# Patient Record
Sex: Female | Born: 1965 | Race: Black or African American | Hispanic: No | Marital: Single | Smoking: Never smoker
Health system: Southern US, Community
[De-identification: ages and names within clinical notes are randomized; demographics above are authoritative.]

## PROBLEM LIST (undated history)

## (undated) HISTORY — PX: CERVICAL CERCLAGE: SHX1329

---

## 2005-10-13 ENCOUNTER — Observation Stay
Admission: RE | Admit: 2005-10-13 | Disposition: A | Discharge status: Home / Self Care | Payer: Self-pay | Source: Ambulatory Visit | Admitting: Obstetrics

## 2005-10-15 ENCOUNTER — Inpatient Hospital Stay
Admission: RE | Admit: 2005-10-15 | Disposition: A | Discharge status: Home / Self Care | Payer: Self-pay | Source: Ambulatory Visit | Admitting: Obstetrics

## 2005-11-26 ENCOUNTER — Ambulatory Visit
Admit: 2005-11-26 | Disposition: A | Discharge status: Home / Self Care | Payer: Self-pay | Source: Ambulatory Visit | Admitting: Obstetrics

## 2005-12-04 ENCOUNTER — Observation Stay
Admission: RE | Admit: 2005-12-04 | Disposition: A | Discharge status: Home / Self Care | Payer: Self-pay | Source: Ambulatory Visit | Admitting: Obstetrics

## 2005-12-04 LAB — COMPREHENSIVE METABOLIC PANEL - AH CERNER
ALT: 22 U/L (ref 0–31)
AST (SGOT): 48 U/L — ABNORMAL HIGH (ref 0–31)
Albumin/Globulin Ratio: 1.2 (ref 1.1–2.2)
Albumin: 3.6 g/dL (ref 3.4–4.8)
Alkaline Phosphatase: 148 U/L — ABNORMAL HIGH (ref 35–104)
Anion Gap: 11 mEq/L (ref 5–15)
BUN: 9 mg/dL (ref 6–20)
Bilirubin, Total: 0.4 mg/dL (ref 0.0–1.0)
CA: 4.3 mEq/L (ref 3.8–4.6)
CO2: 22.2 mEq/L (ref 22.0–29.0)
Calcium: 9.5 mg/dL (ref 8.4–10.2)
Chloride: 105 mEq/L (ref 96–108)
Creatinine: 0.6 mg/dL (ref 0.4–1.1)
Globulin: 3 g/dL (ref 2.0–3.6)
Glucose: 68 mg/dL
Osmolality Calculated: 283 mosm/kg (ref 282–298)
Potassium: 5.1 mEq/L (ref 3.3–5.1)
Protein, Total: 6.6 g/dL (ref 6.4–8.3)
Sodium: 138 mEq/L (ref 135–145)
UN/CREA SOFT: 15 RATIO (ref 6–33)

## 2005-12-04 LAB — CBC WITH AUTO DIFFERENTIAL CERNER
Basophils Absolute: 0 /mm3 (ref 0.0–0.2)
Basophils: 0 % (ref 0–2)
Eosinophils Absolute: 0.1 /mm3 (ref 0.0–0.2)
Eosinophils: 1 % (ref 0–5)
Granulocytes Absolute: 11.5 /mm3 — ABNORMAL HIGH (ref 1.8–8.1)
Hematocrit: 40.7 % (ref 37.0–47.0)
Hgb: 14.4 G/DL (ref 13.0–17.0)
Lymphocytes Absolute: 2 /mm3 (ref 0.5–4.4)
Lymphocytes: 13 % — ABNORMAL LOW (ref 15–41)
MCH: 32.3 PG — ABNORMAL HIGH (ref 28.0–32.0)
MCHC: 35.4 G/DL (ref 32.0–36.0)
MCV: 91.3 FL (ref 80.0–100.0)
MPV: 10.2 FL (ref 7.4–10.4)
Monocytes Absolute: 1.8 /mm3 — ABNORMAL HIGH (ref 0.0–1.2)
Monocytes: 12 % — ABNORMAL HIGH (ref 0–11)
Neutrophils %: 75 % (ref 52–75)
Platelets: 204 /mm3 (ref 140–400)
RBC: 4.46 /mm3 (ref 4.20–5.40)
RDW: 13.2 % (ref 11.5–15.0)
WBC: 15.4 /mm3 — ABNORMAL HIGH (ref 3.5–10.8)

## 2005-12-04 LAB — URINALYSIS WITH MICROSCOPIC
Bilirubin, UA: NEGATIVE
Blood, UA: NEGATIVE
Glucose, UA: NEGATIVE
Ketones UA: NEGATIVE
Leukocyte Esterase, UA: NEGATIVE
Nitrite, UA: NEGATIVE
Protein, UR: NEGATIVE
Specific Gravity UA POCT: 1.017 (ref 1.005–1.030)
Squamous Epithelial Cells, Urine: 2 /LPF — ABNORMAL HIGH (ref 0–0)
Urine pH: 6.5 (ref 4.6–8.0)
Urobilinogen, UA: 0.2 EU/dL (ref 0.2–1.0)

## 2005-12-04 LAB — GFR

## 2005-12-04 LAB — HEMOLYSIS INDEX: Hemolysis Index: 241 Units

## 2005-12-07 ENCOUNTER — Inpatient Hospital Stay
Admission: RE | Admit: 2005-12-07 | Disposition: A | Discharge status: Home / Self Care | Payer: Self-pay | Source: Ambulatory Visit | Admitting: Obstetrics

## 2005-12-07 LAB — CBC WITH AUTO DIFFERENTIAL CERNER
Basophils Absolute: 0 /mm3 (ref 0.0–0.2)
Basophils: 0 % (ref 0–2)
Eosinophils Absolute: 0.1 /mm3 (ref 0.0–0.2)
Eosinophils: 1 % (ref 0–5)
Granulocytes Absolute: 10.6 /mm3 — ABNORMAL HIGH (ref 1.8–8.1)
Hematocrit: 37.6 % (ref 37.0–47.0)
Hgb: 13.3 G/DL (ref 13.0–17.0)
Lymphocytes Absolute: 2.1 /mm3 (ref 0.5–4.4)
Lymphocytes: 14 % — ABNORMAL LOW (ref 15–41)
MCH: 32.4 PG — ABNORMAL HIGH (ref 28.0–32.0)
MCHC: 35.3 G/DL (ref 32.0–36.0)
MCV: 91.8 FL (ref 80.0–100.0)
MPV: 9.4 FL (ref 7.4–10.4)
Monocytes Absolute: 1.7 /mm3 — ABNORMAL HIGH (ref 0.0–1.2)
Monocytes: 12 % — ABNORMAL HIGH (ref 0–11)
Neutrophils %: 73 % (ref 52–75)
Platelets: 231 /mm3 (ref 140–400)
RBC: 4.09 /mm3 — ABNORMAL LOW (ref 4.20–5.40)
RDW: 13.1 % (ref 11.5–15.0)
WBC: 14.6 /mm3 — ABNORMAL HIGH (ref 3.5–10.8)

## 2005-12-07 LAB — PILOT TUBE(SOFT)

## 2006-10-14 ENCOUNTER — Inpatient Hospital Stay
Admission: AD | Admit: 2006-10-14 | Disposition: A | Discharge status: Home / Self Care | Payer: Self-pay | Source: Ambulatory Visit | Admitting: Obstetrics & Gynecology

## 2006-10-14 LAB — CBC WITH AUTO DIFFERENTIAL CERNER
Basophils Absolute: 0.1 /mm3 (ref 0.0–0.2)
Basophils: 1 % (ref 0–2)
Eosinophils Absolute: 0.2 /mm3 (ref 0.0–0.2)
Eosinophils: 2 % (ref 0–5)
Granulocytes Absolute: 8.7 /mm3 — ABNORMAL HIGH (ref 1.8–8.1)
Hematocrit: 37.9 % (ref 37.0–47.0)
Hgb: 12.9 G/DL (ref 12.0–16.0)
Lymphocytes Absolute: 2 /mm3 (ref 0.5–4.4)
Lymphocytes: 16 % (ref 15–41)
MCH: 32.7 PG — ABNORMAL HIGH (ref 28.0–32.0)
MCHC: 34.2 G/DL (ref 32.0–36.0)
MCV: 95.6 FL (ref 80.0–100.0)
MPV: 7.3 FL — ABNORMAL LOW (ref 7.4–10.4)
Monocytes Absolute: 1.4 /mm3 — ABNORMAL HIGH (ref 0.0–1.2)
Monocytes: 11 % (ref 0–11)
Neutrophils %: 71 % (ref 52–75)
Platelets: 330 /mm3 (ref 140–400)
RBC: 3.96 /mm3 — ABNORMAL LOW (ref 4.20–5.40)
RDW: 13.4 % (ref 11.5–15.0)
WBC: 12.4 /mm3 — ABNORMAL HIGH (ref 3.5–10.8)

## 2006-10-14 LAB — URINALYSIS WITH MICROSCOPIC
Bilirubin, UA: NEGATIVE
Blood, UA: NEGATIVE
Glucose, UA: NEGATIVE
Ketones UA: NEGATIVE
Leukocyte Esterase, UA: NEGATIVE
Nitrite, UA: NEGATIVE
Protein, UR: NEGATIVE
RBC, UA: 1 /HPF (ref 0–3)
Specific Gravity UA POCT: 1.012 (ref 1.005–1.030)
Urine pH: 7.5 (ref 4.6–8.0)
Urobilinogen, UA: 0.2 EU/dL (ref 0.2–1.0)

## 2006-10-14 LAB — PILOT TUBE(SOFT)

## 2006-10-15 LAB — FETAL FIBRONECTIN: Fetal Fibronectin: NEGATIVE

## 2006-10-19 ENCOUNTER — Ambulatory Visit
Admit: 2006-10-19 | Disposition: A | Discharge status: Home / Self Care | Payer: Self-pay | Source: Ambulatory Visit | Admitting: Obstetrics & Gynecology

## 2006-10-19 ENCOUNTER — Inpatient Hospital Stay
Admission: RE | Admit: 2006-10-19 | Disposition: A | Discharge status: Home / Self Care | Payer: Self-pay | Source: Ambulatory Visit | Admitting: Obstetrics & Gynecology

## 2006-10-19 LAB — CBC WITH AUTO DIFFERENTIAL CERNER
Basophils Absolute: 0.1 /mm3 (ref 0.0–0.2)
Basophils: 1 % (ref 0–2)
Eosinophils Absolute: 0.1 /mm3 (ref 0.0–0.2)
Eosinophils: 1 % (ref 0–5)
Granulocytes Absolute: 9.8 /mm3 — ABNORMAL HIGH (ref 1.8–8.1)
Hematocrit: 38.9 % (ref 37.0–47.0)
Hgb: 13.6 G/DL (ref 12.0–16.0)
Lymphocytes Absolute: 2.1 /mm3 (ref 0.5–4.4)
Lymphocytes: 16 % (ref 15–41)
MCH: 32.8 PG — ABNORMAL HIGH (ref 28.0–32.0)
MCHC: 34.9 G/DL (ref 32.0–36.0)
MCV: 94 FL (ref 80.0–100.0)
MPV: 7.3 FL — ABNORMAL LOW (ref 7.4–10.4)
Monocytes Absolute: 1.1 /mm3 (ref 0.0–1.2)
Monocytes: 9 % (ref 0–11)
Neutrophils %: 74 % (ref 52–75)
Platelets: 329 /mm3 (ref 140–400)
RBC: 4.14 /mm3 — ABNORMAL LOW (ref 4.20–5.40)
RDW: 13.5 % (ref 11.5–15.0)
WBC: 13.2 /mm3 — ABNORMAL HIGH (ref 3.5–10.8)

## 2006-10-19 LAB — URINALYSIS WITH MICROSCOPIC
Bilirubin, UA: NEGATIVE
Blood, UA: NEGATIVE
Glucose, UA: NEGATIVE
Ketones UA: 15
Leukocyte Esterase, UA: NEGATIVE
Nitrite, UA: NEGATIVE
Protein, UR: NEGATIVE
Specific Gravity UA POCT: 1.009 (ref 1.005–1.030)
Urine pH: 7.5 (ref 4.6–8.0)
Urobilinogen, UA: 0.2 EU/dL (ref 0.2–1.0)

## 2006-10-19 LAB — GLUCOSE, BODY FLUID: Body Fluid Glucose: 34 mg/dL

## 2006-10-19 LAB — BODY FLUID CELL COUNT
Body Fluid Polymorphonuclear Cell: 8 %
Body Fluid RBC: 898 /mm3
Body Fluid WBC: 9 /mm3

## 2006-10-19 LAB — TYPE AND SCREEN: AB Screen Gel: NEGATIVE

## 2006-10-25 LAB — CULTURE, MYCOPLASMA HOMINIS

## 2006-10-27 ENCOUNTER — Ambulatory Visit
Admit: 2006-10-27 | Disposition: A | Discharge status: Home / Self Care | Payer: Self-pay | Source: Ambulatory Visit | Admitting: Obstetrics & Gynecology

## 2006-10-30 LAB — CYTOGENETICS-AMNIOTIC FLUID

## 2006-11-10 ENCOUNTER — Ambulatory Visit
Admit: 2006-11-10 | Disposition: A | Discharge status: Home / Self Care | Payer: Self-pay | Source: Ambulatory Visit | Admitting: Psychiatry

## 2006-11-27 ENCOUNTER — Ambulatory Visit
Admit: 2006-11-27 | Disposition: A | Discharge status: Home / Self Care | Payer: Self-pay | Source: Ambulatory Visit | Admitting: Obstetrics & Gynecology

## 2006-11-30 ENCOUNTER — Ambulatory Visit
Admit: 2006-11-30 | Disposition: A | Discharge status: Home / Self Care | Payer: Self-pay | Source: Ambulatory Visit | Admitting: Obstetrics & Gynecology

## 2006-12-22 ENCOUNTER — Observation Stay
Admission: RE | Admit: 2006-12-22 | Disposition: A | Discharge status: Home / Self Care | Payer: Self-pay | Source: Ambulatory Visit | Admitting: Obstetrics & Gynecology

## 2006-12-22 LAB — URINALYSIS WITH MICROSCOPIC
Bilirubin, UA: NEGATIVE
Blood, UA: NEGATIVE
Glucose, UA: NEGATIVE
Ketones UA: NEGATIVE
Leukocyte Esterase, UA: NEGATIVE
Nitrite, UA: NEGATIVE
Protein, UR: NEGATIVE
Specific Gravity UA POCT: 1.014 (ref 1.005–1.030)
Urine pH: 6.5 (ref 4.6–8.0)
Urobilinogen, UA: 0.2 EU/dL (ref 0.2–1.0)

## 2006-12-22 LAB — CBC WITH AUTO DIFFERENTIAL CERNER
Basophils Absolute: 0 /mm3 (ref 0.0–0.2)
Basophils: 0 % (ref 0–2)
Eosinophils Absolute: 0 /mm3 (ref 0.0–0.2)
Eosinophils: 0 % (ref 0–5)
Granulocytes Absolute: 10.5 /mm3 — ABNORMAL HIGH (ref 1.8–8.1)
Hematocrit: 40.9 % (ref 37.0–47.0)
Hgb: 14 G/DL (ref 12.0–16.0)
Lymphocytes Absolute: 0.9 /mm3 (ref 0.5–4.4)
Lymphocytes: 7 % — ABNORMAL LOW (ref 15–41)
MCH: 32.5 PG — ABNORMAL HIGH (ref 28.0–32.0)
MCHC: 34.2 G/DL (ref 32.0–36.0)
MCV: 95 FL (ref 80.0–100.0)
MPV: 8.4 FL (ref 7.4–10.4)
Monocytes Absolute: 0.8 /mm3 (ref 0.0–1.2)
Monocytes: 7 % (ref 0–11)
Neutrophils %: 86 % — ABNORMAL HIGH (ref 52–75)
Platelets: 236 /mm3 (ref 140–400)
RBC: 4.3 /mm3 (ref 4.20–5.40)
RDW: 12.5 % (ref 11.5–15.0)
WBC: 12.2 /mm3 — ABNORMAL HIGH (ref 3.5–10.8)

## 2006-12-22 LAB — PILOT TUBE(SOFT)

## 2006-12-24 ENCOUNTER — Ambulatory Visit: Admit: 2006-12-24 | Disposition: A | Discharge status: Home / Self Care | Payer: Self-pay | Source: Ambulatory Visit

## 2007-01-05 ENCOUNTER — Ambulatory Visit
Admit: 2007-01-05 | Disposition: A | Discharge status: Home / Self Care | Payer: Self-pay | Source: Ambulatory Visit | Admitting: Obstetrics & Gynecology

## 2007-01-15 ENCOUNTER — Ambulatory Visit
Admit: 2007-01-15 | Disposition: A | Discharge status: Home / Self Care | Payer: Self-pay | Source: Ambulatory Visit | Admitting: Obstetrics & Gynecology

## 2007-01-18 ENCOUNTER — Inpatient Hospital Stay
Admission: RE | Admit: 2007-01-18 | Disposition: A | Discharge status: Home / Self Care | Payer: Self-pay | Source: Ambulatory Visit | Admitting: Obstetrics & Gynecology

## 2007-01-19 ENCOUNTER — Ambulatory Visit
Admit: 2007-01-19 | Disposition: A | Discharge status: Home / Self Care | Payer: Self-pay | Source: Ambulatory Visit | Admitting: Obstetrics & Gynecology

## 2008-03-09 ENCOUNTER — Ambulatory Visit: Admission: RE | Admit: 2008-03-09 | Payer: Self-pay | Source: Ambulatory Visit | Admitting: Obstetrics & Gynecology

## 2008-09-19 ENCOUNTER — Emergency Department: Admit: 2008-09-19 | Payer: Self-pay | Source: Emergency Department | Admitting: Emergency Medicine

## 2008-09-19 LAB — GFR

## 2008-09-19 LAB — CBC AND DIFFERENTIAL
Basophils Absolute: 0 /mm3 (ref 0.0–0.2)
Basophils: 0 % (ref 0–2)
Eosinophils Absolute: 0.1 /mm3 (ref 0.0–0.7)
Eosinophils: 1 % (ref 0–5)
Granulocytes Absolute: 7.6 /mm3 (ref 1.8–8.1)
Hematocrit: 43.3 % (ref 37.0–47.0)
Hgb: 14.6 G/DL (ref 12.0–16.0)
Immature Granulocytes Absolute: 0 CUMM (ref 0.0–0.0)
Immature Granulocytes: 0 % (ref 0–1)
Lymphocytes Absolute: 1.8 /mm3 (ref 0.5–4.4)
Lymphocytes: 16 % (ref 15–41)
MCH: 31.3 PG (ref 28.0–32.0)
MCHC: 33.7 G/DL (ref 32.0–36.0)
MCV: 92.9 FL (ref 80.0–100.0)
MPV: 9.8 FL (ref 9.4–12.3)
Monocytes Absolute: 1.1 /mm3 (ref 0.0–1.2)
Monocytes: 11 % (ref 0–11)
Neutrophils %: 72 % (ref 52–75)
Platelets: 343 /mm3 (ref 140–400)
RBC: 4.66 /mm3 (ref 4.20–5.40)
RDW: 12.7 % (ref 11.5–15.0)
WBC: 10.65 /mm3 (ref 3.50–10.80)

## 2008-09-19 LAB — COMPREHENSIVE METABOLIC PANEL - AH CERNER
ALT: 10 U/L (ref 0–31)
AST (SGOT): 14 U/L (ref 0–31)
Albumin/Globulin Ratio: 1.2 (ref 1.1–2.2)
Albumin: 4.1 g/dL (ref 3.4–4.8)
Alkaline Phosphatase: 46 U/L (ref 35–104)
Anion Gap: 9 mEq/L (ref 5–15)
BUN: 12 mg/dL (ref 6–20)
Bilirubin, Total: 1 mg/dL (ref 0.0–1.0)
CA: 3.9 mEq/L (ref 3.8–4.6)
CO2: 25.5 mEq/L (ref 22.0–29.0)
Calcium: 9.3 mg/dL (ref 8.4–10.2)
Chloride: 101 mEq/L (ref 96–108)
Creatinine: 0.7 mg/dL (ref 0.4–1.1)
Globulin: 3.5 g/dL (ref 2.0–3.6)
Glucose: 102 mg/dL — ABNORMAL HIGH (ref 70–100)
Osmolality Calculated: 280 mosm/kg — ABNORMAL LOW (ref 282–298)
Potassium: 4.2 mEq/L (ref 3.3–5.1)
Protein, Total: 7.6 g/dL (ref 6.4–8.3)
Sodium: 135 mEq/L (ref 133–145)
UN/CREA SOFT: 17 RATIO (ref 6–33)

## 2008-09-19 LAB — AMYLASE: Amylase: 103 U/L (ref 20–148)

## 2008-09-19 LAB — URINALYSIS WITH MICROSCOPIC
Bilirubin, UA: NEGATIVE
Blood, UA: NEGATIVE
Glucose, UA: NEGATIVE
Ketones UA: NEGATIVE
Nitrite, UA: NEGATIVE
Protein, UR: NEGATIVE
Specific Gravity UA POCT: 1.03 (ref 1.005–1.030)
Squamous Epithelial Cells, Urine: 3 /LPF — ABNORMAL HIGH (ref 0–0)
Urine pH: 6 (ref 4.6–8.0)
Urobilinogen, UA: 4 EU/dL — ABNORMAL HIGH (ref 0.2–1.0)
WBC, UA: 5 /HPF (ref 0–5)

## 2008-09-19 LAB — D-DIMER (DVT) CERNER: D-Dimer (DVT): 177 ng mL (ref 0–600)

## 2008-09-19 LAB — HEMOLYSIS INDEX: Hemolysis Index: 4 Units

## 2008-09-19 LAB — LIPASE: Lipase: 31 U/L (ref 13–60)

## 2010-10-02 ENCOUNTER — Emergency Department: Admit: 2010-10-02 | Payer: Self-pay | Source: Emergency Department | Admitting: Family

## 2011-09-03 LAB — ECG 12-LEAD
Atrial Rate: 85 {beats}/min
P Axis: 69 degrees
P-R Interval: 164 ms
Q-T Interval: 334 ms
QRS Duration: 66 ms
QTC Calculation (Bezet): 397 ms
R Axis: 69 degrees
T Axis: 38 degrees
Ventricular Rate: 85 {beats}/min

## 2011-09-18 NOTE — Discharge Summary (Signed)
PATIENTCHAVA, Robin Rollins      MEDICAL RECORD NUMBER:         [id number]            ATTENDING PHYSICIAN:           Roselle Locus, MD      DATE OF ADMISSION:             01/18/2007      DATE OF DISCHARGE:             01/20/2007            DISCHARGE DIAGNOSIS:      1.    Delivery of live female infant.            ADMITTING DIAGNOSIS:      1.    Term pregnancy.      2.    Active labor.            HOSPITAL COURSE: The patient is a 45 year old G2, P1-0-0-1 at 38 weeks by      estimated date of confinement of 02/01/07 who presented in active labor 10      centimeters dilated with vertex presentation in the peroneum.  There was      spontaneous rupture of membranes with clear fluid on admission. Group Beta      streptococcus vaginal culture was negative.  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      Emergency cerclage was placed at that time.  The cerclage was subsequently      removed at approximately 34 weeks when she had pre term contractions and was      noted to be 2 cm dilated.  The remainder of her prenatal course was      unremarkable.  On 01/18/07 she underwent spontaneous vaginal delivery of a      live female infant, 5 pounds 12.9 ounces with Apgars of 9 and 9.  Her      postpartum course was unremarkable and she was discharged to home in stable      condition with instructions to follow-up in the office in six weeks.                  Electronic Signing MD: Roselle Locus, MD            TTP:amw:kp      D:    01/20/2007      T:    01/20/2007      #:    [id number]      N:    [id number]            cc:   Roselle Locus, MD

## 2011-09-18 NOTE — Op Note (Unsigned)
PATIENTMARVELLA, Robin Rollins      MEDICAL RECORD NUMBER:         [id number]      PATIENT LOCATION/SERVICE:      LAD 2013A            DATE OF SURGERY:               10/19/2006      SURGEON:                       Chalmers Guest, MD      ASSISTANT 1:            PREOPERATIVE DIAGNOSES      1.   Intrauterine pregnancy at 25-1/7 weeks.      2.   Suspect incompetent cervix.            POSTOPERATIVE DIAGNOSES      1.   Intrauterine pregnancy at 25-1/7 weeks.      2.   Suspect incompetent cervix.            PROCEDURE:  Physical examination indicated McDonald cerclage.            ANESTHESIA:  Epidural.            INTRAVENOUS FLUIDS:  600 mL of lactated Ringer's.            FINDINGS:  A cervix that is dilated 2 cm through the complete internal and      external os with membranes to the external os though not protruding through      it.            COMPLICATIONS:  There were no complications.            ESTIMATED BLOOD LOSS:  Minimal.            DRAINS:  No drains.            INDICATIONS FOR PROCEDURE:  This is a 45 year old G2, P1 who was followed      during her previous pregnancy for short cervix and preterm labor but who      ultimately went to full term without any need for surgical intervention.      She presented in this pregnancy with short cervix.  Initially, this was      thought to be due to some preterm contractions.  She was tocolyzed and sent      home.  She then returned at 25 weeks, about a week later, with dilation of      the cervix and now acontractile.  The presentation at this point was now      thought to be more consistent with cervical incompetence.  The patient was      offered amniocentesis to rule out intra-amniotic infection and further      possibility of a cerclage.  No intra-amniotic infection was suggested by      preliminary results and the patient was offered cervical cerclage.  The      risks and benefits of the procedure including risks of infection, bleeding,      rupture of  membranes, failure of the procedure were explained to the      patient and she was brought to the operating room.            DESCRIPTION OF  PROCEDURE:  The patient was brought to the operating room as      above.  Epidural anesthesia was obtained without difficulty.  She was      prepped and draped in the normal sterile fashion in the dorsolithotomy      position.  Speculum was placed into the patient's vagina.  Cervix was      visualized and a circumferential suture, 5 mm Mersilene, was placed from 12      o'clock to 9 o'clock, 9 o'clock to 6 o'clock, 6 o'clock to 3 o'clock and      back to the 12 o'clock position.  This was cinched down tightly.  A total      of 7 knots were placed.  Following the procedure, the patient was brought      to the recovery area where she will be observed overnight.  She will      receive antibiotics and Indocin overnight and contractions will be      monitored for.                                          ___________________________________     Date Signed: _______________      Chalmers Guest, MD                  [id number]      D:    10/19/2006      T:    10/20/2006      #:    [id number]      N:    [id number]            cc:   Chalmers Guest, MD

## 2011-09-19 NOTE — Op Note (Unsigned)
PATIENTLENNIX, Robin Rollins      MEDICAL RECORD NUMBER:         [id number]      PATIENT LOCATION/SERVICE:       [id number]            DATE OF SURGERY:               03/09/2008      SURGEON:                       Clarnce Flock, MD      ASSISTANT 1:            ANESTHESIA: TIVA and local.            PREOPERATIVE DIAGNOSIS:  Bilateral Bartholin gland cyst.            POSTOPERATIVE DIAGNOSIS: Bilateral Bartholin gland cyst.            PROCEDURE: Marsupialization of bilateral Bartholin gland cyst.            FINDINGS:  Four centimeter left Bartholin cyst with contents purulent.      Right Bartholin cyst approximately three centimeter with clear fluid as      contents.  No other abnormal findings.            PATHOLOGY: Bartholin gland cyst walls.            COMPLICATIONS: None.            DESCRIPTION OF THE PROCEDURE: The patient was taken to the operating room      where once adequate level of anesthesia was reached was prepared and draped      in the usual sterile fashion in the dorsal lithotomy position.  The      Bartholin gland cyst was noted on the left side and using 0.50% Marcaine      plain this was injected subcutaneously.  An incision was made within the      Hymenal ring that was approximately 2.5 centimeter in length until the cyst      was identified and the cyst ruptured and the above findings were noted.  A      biopsy was taken of this cyst wall and a culture was taken of the cyst      contents.  The cyst wall was then everted and sutured using interrupted      #3-0 Chromic sutures so that the opening was secured and everted to      complete the marsupialization.  This was performed circumferentially around      the edges.  Excellent hemostasis was noted.  In a similar fashion the right      Bartholin gland cyst was injected subcutaneously using 0.5% Marcaine plain.      An incision was made within the hymenal ring approximately 2 centimeter in      length and carried through until  the cyst was ruptured and then the cyst      wall was biopsied and the cyst wall was everted so that it was sutured to      the vaginal wall so that the opening would be secured in a circumferential      fashion using #3-0 Chromic suture. Excellent hemostasis was noted.  All      instruments, sponge counts and needle counts  were correct x 2.  The patient      tolerated the procedure well and was transferred to the recovery room in      stable condition.                  ___________________________________          Date Signed: __________      Clarnce Flock, MD  ([id number])            JRS:amw:kp      D:    03/09/2008      T:    03/10/2008      #:    [id number]      N:    [id number]            cc:   Clarnce Flock, MD

## 2014-12-27 ENCOUNTER — Other Ambulatory Visit (INDEPENDENT_AMBULATORY_CARE_PROVIDER_SITE_OTHER): Payer: Self-pay | Admitting: Surgery

## 2014-12-27 ENCOUNTER — Encounter (INDEPENDENT_AMBULATORY_CARE_PROVIDER_SITE_OTHER): Payer: Self-pay | Admitting: Surgery

## 2014-12-27 ENCOUNTER — Ambulatory Visit (INDEPENDENT_AMBULATORY_CARE_PROVIDER_SITE_OTHER): Payer: BLUE CROSS/BLUE SHIELD | Admitting: Surgery

## 2014-12-27 ENCOUNTER — Inpatient Hospital Stay
Admission: RE | Admit: 2014-12-27 | Discharge: 2014-12-27 | Disposition: A | Discharge status: Home / Self Care | Payer: Self-pay | Source: Ambulatory Visit | Attending: Surgery | Admitting: Surgery

## 2014-12-27 VITALS — BP 131/84 | HR 81 | Temp 98.3°F | Ht 60.0 in | Wt 121.0 lb

## 2014-12-27 DIAGNOSIS — N6459 Other signs and symptoms in breast: Secondary | ICD-10-CM

## 2014-12-27 DIAGNOSIS — R928 Other abnormal and inconclusive findings on diagnostic imaging of breast: Secondary | ICD-10-CM

## 2014-12-27 DIAGNOSIS — R920 Mammographic microcalcification found on diagnostic imaging of breast: Secondary | ICD-10-CM

## 2014-12-27 NOTE — Progress Notes (Signed)
Subjective:       Patient ID: Robin Rollins is a 49 y.o. female.    HPI  Robin Rollins is a 49 year old female who reports a recent abnormal screening mammogram, occurring in December 2015.  This was her baseline study.  She was called back for additional views, occurring on November 30, 2014.  A right diagnostic mammogram reports pleomorphic calcifications in the upper outer quadrant, deep in the breast overlying the muscle, suspicious for malignancy.  A targeted right breast ultrasound shows a 4 x 2 mm echopenic solid nodule in the 9:00 axis, corresponding to the abnormality on mammogram.  She was recommended for surgical consultation, and was referred to me.    Otherwise, she denies current or prior breast problems.  She has not noticed any breast pain, breast masses, skin changes or retraction, nor any nipple abnormalities or discharge.  She has not previously required breast biopsy or breast surgery.    FAMILY HISTORY: She has no family history of breast or ovarian cancer.    The following portions of the patient's history were reviewed and updated as appropriate: allergies, current medications, past family history, past medical history, past social history, past surgical history and problem list.    Review of Systems  Constitutional:  negative  HEENT:  negative  Cardiovascular:  negative  Respiratory:  negative  Gastrointestinal:  negative  Genitourinary:  negative  Musculoskeletal:  negative  Skin:  negative  Neurologic:  negative  Psychiatric:  negative  Endocrine:  negative  Hematologic:  negative              Objective:    Physical Exam   Constitutional: She is oriented to person, place, and time. She appears well-developed and well-nourished.   HENT:   Head: Normocephalic and atraumatic.   Eyes: No scleral icterus.   Neck: Normal range of motion. Neck supple.   Cardiovascular: Normal rate and regular rhythm.    Pulmonary/Chest: Breath sounds normal. Right breast exhibits no inverted nipple, no mass, no  nipple discharge, no skin change and no tenderness. Left breast exhibits no inverted nipple, no mass, no nipple discharge, no skin change and no tenderness. Breasts are symmetrical.       Prominent right axillary node.   Abdominal: Soft. She exhibits no distension. There is no tenderness.   Musculoskeletal:        Right shoulder: Normal.        Left shoulder: Normal.   Lymphadenopathy:     She has no cervical adenopathy.     She has no axillary adenopathy.        Right: No supraclavicular adenopathy present.        Left: No supraclavicular adenopathy present.   Neurological: She is alert and oriented to person, place, and time.   Skin: Skin is warm and dry. No lesion noted.       REVIEW OF IMAGING:  November 09, 2014: [facility]) A bilateral screening mammogram shows a tissue asymmetry in the upper outer right breast.    November 30, 2014: [facility]) A right diagnostic mammogram shows pleomorphic calcifications in the upper outer quadrant, deep in the breast overlying the muscle, suspicious for malignancy.  A targeted right breast ultrasound shows a 4 x 2 mm echopenic solid nodule in the 9:00 axis, corresponding to the abnormality on mammogram.  She is recommended for surgical consultation.  BI-RADS 4.        Assessment:  A 49 year old female with suspicious right breast imaging.      Plan:       In consultation with the patient, I reviewed the findings of her physical exam and recent imaging studies.  There are no palpable abnormalities in either breast.  There is a mildly prominent right axillary lymph node.  At the time of her visit, I did not have the actual films available for review.  We discussed the reports.  It sounds as though the calcifications of concern are located in a posterior position that would not be amenable to stereotactic biopsy.  We discussed possible biopsy options.  If possible, it is preferable to perform a needle biopsy.  If not possible, we  will proceed with a surgical biopsy.    After her visit, she retrieved the images from [facility].  These were reviewed with an [place] radiologist.  She has recommended additional imaging of the right breast calcifications and possible additional ultrasound.  The type of biopsy will be determined after the additional imaging is performed.  My office will call to have her scheduled for repeat imaging as soon as possible.  Her follow-up plan will be dependent on the type of biopsy that is required.      Procedures

## 2014-12-28 ENCOUNTER — Telehealth (INDEPENDENT_AMBULATORY_CARE_PROVIDER_SITE_OTHER): Payer: Self-pay

## 2014-12-28 ENCOUNTER — Other Ambulatory Visit (INDEPENDENT_AMBULATORY_CARE_PROVIDER_SITE_OTHER): Payer: Self-pay

## 2014-12-28 DIAGNOSIS — R599 Enlarged lymph nodes, unspecified: Secondary | ICD-10-CM

## 2014-12-28 DIAGNOSIS — R921 Mammographic calcification found on diagnostic imaging of breast: Secondary | ICD-10-CM

## 2014-12-28 DIAGNOSIS — N63 Unspecified lump in unspecified breast: Secondary | ICD-10-CM

## 2014-12-28 NOTE — Telephone Encounter (Addendum)
Pt informed of recommendation for R breast mammo and u/s of breast/axilla for area of calcs and mildly enlarged LN.  Appt made for 9:30 12/29/14 at [facility] with [place]. Pt confirmed she is able to make appt.       ----- Message from [facility] sent at 12/28/2014  8:54 AM EST -----  Contact: Patient  Good morning Robin Rollins,    Pt called, as per PT she is returning Dr. Zigmund Gottron call. She can be reached at   [phone].    Thanks & regards  [place]

## 2014-12-29 ENCOUNTER — Other Ambulatory Visit (INDEPENDENT_AMBULATORY_CARE_PROVIDER_SITE_OTHER): Payer: Self-pay

## 2014-12-29 ENCOUNTER — Ambulatory Visit: Payer: BLUE CROSS/BLUE SHIELD | Attending: Surgery

## 2014-12-29 ENCOUNTER — Ambulatory Visit: Payer: BLUE CROSS/BLUE SHIELD

## 2014-12-29 ENCOUNTER — Telehealth (INDEPENDENT_AMBULATORY_CARE_PROVIDER_SITE_OTHER): Payer: Self-pay

## 2014-12-29 DIAGNOSIS — R921 Mammographic calcification found on diagnostic imaging of breast: Secondary | ICD-10-CM

## 2014-12-29 DIAGNOSIS — R599 Enlarged lymph nodes, unspecified: Secondary | ICD-10-CM

## 2014-12-29 DIAGNOSIS — N63 Unspecified lump in unspecified breast: Secondary | ICD-10-CM

## 2014-12-29 DIAGNOSIS — R591 Generalized enlarged lymph nodes: Secondary | ICD-10-CM | POA: Insufficient documentation

## 2014-12-29 NOTE — Telephone Encounter (Signed)
Pt called to discuss her imaging today and recommendation/scheduling of her R breast stereotactic bx for calcifications at [facility], scheduled for 01/05/15.  F/u appt scheduled for pt with [place] 01/12/15 at 8:45 Alex. office.

## 2014-12-30 DIAGNOSIS — R920 Mammographic microcalcification found on diagnostic imaging of breast: Secondary | ICD-10-CM | POA: Insufficient documentation

## 2014-12-30 DIAGNOSIS — R928 Other abnormal and inconclusive findings on diagnostic imaging of breast: Secondary | ICD-10-CM | POA: Insufficient documentation

## 2015-01-05 ENCOUNTER — Other Ambulatory Visit (INDEPENDENT_AMBULATORY_CARE_PROVIDER_SITE_OTHER): Payer: Self-pay | Admitting: Surgery

## 2015-01-05 ENCOUNTER — Ambulatory Visit: Payer: BLUE CROSS/BLUE SHIELD

## 2015-01-05 ENCOUNTER — Ambulatory Visit: Payer: BLUE CROSS/BLUE SHIELD | Attending: Surgery

## 2015-01-05 DIAGNOSIS — R921 Mammographic calcification found on diagnostic imaging of breast: Secondary | ICD-10-CM

## 2015-01-09 ENCOUNTER — Telehealth (INDEPENDENT_AMBULATORY_CARE_PROVIDER_SITE_OTHER): Payer: Self-pay | Admitting: Surgery

## 2015-01-09 ENCOUNTER — Telehealth (INDEPENDENT_AMBULATORY_CARE_PROVIDER_SITE_OTHER): Payer: Self-pay

## 2015-01-09 NOTE — Telephone Encounter (Signed)
Confirmed pt aware of Dr. Shah/radiologist's recommendation for short term f/u vs R breast excisional bx after unable to complete stereo bx for calcs 01/05/15.  She is requesting phone conversation with Dr. Smitty Cords possibly instead of her f/u 01/12/15.  Emailed Dr. Smitty Cords with pt request and number.

## 2015-01-09 NOTE — Telephone Encounter (Signed)
I called the patient to discuss her recent imaging.  The right-sided calcifications are not amenable to stereotactic biopsy.  This was reviewed with Dr. Sherryll Burger in radiology.  She does not think the calcifications are highly suspicious and has recommended observation as an alternative to surgical excision.  We reviewed these options.  Robin Rollins prefers observation.  We will plan for repeat imaging and clinical breast exam in August 2016.

## 2015-01-12 ENCOUNTER — Encounter (INDEPENDENT_AMBULATORY_CARE_PROVIDER_SITE_OTHER): Payer: Self-pay

## 2015-01-12 ENCOUNTER — Ambulatory Visit (INDEPENDENT_AMBULATORY_CARE_PROVIDER_SITE_OTHER): Payer: BLUE CROSS/BLUE SHIELD | Admitting: Surgery

## 2015-06-24 ENCOUNTER — Other Ambulatory Visit (INDEPENDENT_AMBULATORY_CARE_PROVIDER_SITE_OTHER): Payer: Self-pay | Admitting: Surgery

## 2015-06-24 DIAGNOSIS — R921 Mammographic calcification found on diagnostic imaging of breast: Secondary | ICD-10-CM

## 2015-07-25 ENCOUNTER — Ambulatory Visit: Payer: BLUE CROSS/BLUE SHIELD

## 2015-07-25 ENCOUNTER — Ambulatory Visit (INDEPENDENT_AMBULATORY_CARE_PROVIDER_SITE_OTHER): Payer: BLUE CROSS/BLUE SHIELD | Admitting: Surgery

## 2015-08-17 ENCOUNTER — Ambulatory Visit (INDEPENDENT_AMBULATORY_CARE_PROVIDER_SITE_OTHER): Payer: BLUE CROSS/BLUE SHIELD | Admitting: Adult Health

## 2015-08-17 ENCOUNTER — Encounter (INDEPENDENT_AMBULATORY_CARE_PROVIDER_SITE_OTHER): Payer: Self-pay | Admitting: Surgery

## 2015-08-17 ENCOUNTER — Ambulatory Visit: Payer: BLUE CROSS/BLUE SHIELD | Attending: Surgery

## 2015-08-17 VITALS — BP 128/76 | HR 73 | Temp 96.5°F | Ht 60.0 in | Wt 117.0 lb

## 2015-08-17 DIAGNOSIS — R921 Mammographic calcification found on diagnostic imaging of breast: Secondary | ICD-10-CM

## 2015-08-17 DIAGNOSIS — Z1239 Encounter for other screening for malignant neoplasm of breast: Secondary | ICD-10-CM

## 2015-08-17 DIAGNOSIS — R920 Mammographic microcalcification found on diagnostic imaging of breast: Secondary | ICD-10-CM

## 2015-08-17 NOTE — Progress Notes (Addendum)
Subjective:       Patient ID: Robin Rollins is a 49 y.o. female.    HPI    Robin Rollins is a 49 year old female who presents for follow-up of right breast calcifications.  She originally presented approximately one year ago after an abnormal screening mammogram showing pleomorphic calcifications in the upper outer quadrant, deep in the breast overlying the muscle, suspicious for malignancy.  She then provided old images from a prior radiology Center.  These were reviewed and it was determined that they were not amenable to stereotactic biopsy and that short-term follow-up was reasonable.    She is without breast concerns today.  She has not noticed any breast pain, breast masses, skin changes or retraction, nor any nipple abnormalities or discharge.  She has not previously required breast biopsy or breast surgery.    FAMILY HISTORY: She has no family history of breast or ovarian cancer.    The following portions of the patient's history were reviewed and updated as appropriate: allergies, current medications, past family history, past medical history, past social history, past surgical history and problem list.    Review of Systems    Constitutional:  negative  HEENT:  negative  Cardiovascular:  negative  Respiratory:  negative  Gastrointestinal:  negative  Genitourinary:  negative  Musculoskeletal:  negative  Skin:  negative  Neurologic:  negative  Psychiatric:  negative  Endocrine:  negative  Hematologic:  negative              Objective:    Physical Exam   Constitutional: She is oriented to person, place, and time. She appears well-developed and well-nourished.   HENT:   Head: Normocephalic and atraumatic.   Eyes: No scleral icterus.   Neck: Normal range of motion. Neck supple.   Cardiovascular: Normal rate and regular rhythm.    Pulmonary/Chest: Breath sounds normal. Right breast exhibits no inverted nipple, no mass, no nipple discharge, no skin change and no tenderness. Left breast exhibits no inverted nipple,  no mass, no nipple discharge, no skin change and no tenderness. Breasts are symmetrical.       Prominent right axillary node.   Abdominal: Soft. She exhibits no distension. There is no tenderness.   Musculoskeletal:        Right shoulder: Normal.        Left shoulder: Normal.   Lymphadenopathy:     She has no cervical adenopathy.     She has no axillary adenopathy.        Right: No supraclavicular adenopathy present.        Left: No supraclavicular adenopathy present.   Neurological: She is alert and oriented to person, place, and time.   Skin: Skin is warm and dry. No lesion noted.       REVIEW OF IMAGING:  November 09, 2014: [facility]) A bilateral screening mammogram shows a tissue asymmetry in the upper outer right breast.    November 30, 2014: [facility]) A right diagnostic mammogram shows pleomorphic calcifications in the upper outer quadrant, deep in the breast overlying the muscle, suspicious for malignancy.  A targeted right breast ultrasound shows a 4 x 2 mm echopenic solid nodule in the 9:00 axis, corresponding to the abnormality on mammogram.  She is recommended for surgical consultation.  BI-RADS 4.    December 29, 2014:  ([place]) A right diagnostic mammogram is performed and shows a group of heterogeneous calcifications, 9 cm from the nipple at 9:00.  Stereotactic biopsy is recommended.    January 05, 2015: A right breast stereotactic biopsy is attempted and cannot be safely targeted with stereotactic guidance.  Excisional biopsy versus short-term follow-up recommended.    August 16, 2014:  ([place])  a right breast mammogram shows stable calcifications.  BI-RADS Category 3.  A 6 month follow-up bilateral mammogram is recommended.        Assessment:       A 49 year old female with stable right breast calcifications.      Plan:       In consultation with the patient, I reviewed the findings of her physical exam and recent imaging studies.  The calcifications  appear stable and the radiologist feels that we can continue to monitor.  I have ordered her annual bilateral mammogram for March 2017.  She will return for follow-up visit after imaging is been performed.    Incident to service performed with physician present in the office in accordance with this patient's established plan of care.        Procedures

## 2016-02-11 ENCOUNTER — Other Ambulatory Visit (INDEPENDENT_AMBULATORY_CARE_PROVIDER_SITE_OTHER): Payer: Self-pay | Admitting: Adult Health

## 2016-02-11 DIAGNOSIS — R920 Mammographic microcalcification found on diagnostic imaging of breast: Secondary | ICD-10-CM

## 2016-02-19 ENCOUNTER — Ambulatory Visit: Payer: BLUE CROSS/BLUE SHIELD | Attending: Adult Health

## 2016-02-19 ENCOUNTER — Other Ambulatory Visit (INDEPENDENT_AMBULATORY_CARE_PROVIDER_SITE_OTHER): Payer: Self-pay | Admitting: Adult Health

## 2016-02-19 ENCOUNTER — Ambulatory Visit: Payer: BLUE CROSS/BLUE SHIELD

## 2016-02-19 DIAGNOSIS — Z1239 Encounter for other screening for malignant neoplasm of breast: Secondary | ICD-10-CM

## 2016-02-19 DIAGNOSIS — R920 Mammographic microcalcification found on diagnostic imaging of breast: Secondary | ICD-10-CM

## 2016-02-20 ENCOUNTER — Ambulatory Visit: Payer: BLUE CROSS/BLUE SHIELD

## 2016-08-28 ENCOUNTER — Other Ambulatory Visit: Payer: BLUE CROSS/BLUE SHIELD

## 2016-09-22 ENCOUNTER — Other Ambulatory Visit (INDEPENDENT_AMBULATORY_CARE_PROVIDER_SITE_OTHER): Payer: Self-pay | Admitting: Surgery

## 2016-09-22 ENCOUNTER — Telehealth (INDEPENDENT_AMBULATORY_CARE_PROVIDER_SITE_OTHER): Payer: Self-pay

## 2016-09-22 DIAGNOSIS — R928 Other abnormal and inconclusive findings on diagnostic imaging of breast: Secondary | ICD-10-CM

## 2016-09-22 NOTE — Telephone Encounter (Signed)
Called patient to remind her that she needs a short term f/u right mammogram.  Patient said she was aware but did not have an order.  Told her I would take care of that right away and she should be able to call Central Scheduling tomorrow.  I gave her the name and number of [facility] in [employer] and faxed the order to [place].

## 2016-09-24 ENCOUNTER — Other Ambulatory Visit (INDEPENDENT_AMBULATORY_CARE_PROVIDER_SITE_OTHER): Payer: Self-pay | Admitting: Surgery

## 2016-09-24 DIAGNOSIS — R928 Other abnormal and inconclusive findings on diagnostic imaging of breast: Secondary | ICD-10-CM

## 2016-10-01 ENCOUNTER — Other Ambulatory Visit: Payer: BLUE CROSS/BLUE SHIELD

## 2016-10-01 ENCOUNTER — Ambulatory Visit: Payer: BLUE CROSS/BLUE SHIELD

## 2016-10-07 ENCOUNTER — Ambulatory Visit: Payer: BLUE CROSS/BLUE SHIELD

## 2016-10-07 ENCOUNTER — Other Ambulatory Visit (INDEPENDENT_AMBULATORY_CARE_PROVIDER_SITE_OTHER): Payer: Self-pay | Admitting: Surgery

## 2016-10-07 ENCOUNTER — Ambulatory Visit: Payer: BLUE CROSS/BLUE SHIELD | Attending: Surgery

## 2016-10-07 DIAGNOSIS — R928 Other abnormal and inconclusive findings on diagnostic imaging of breast: Secondary | ICD-10-CM

## 2017-03-13 ENCOUNTER — Other Ambulatory Visit (INDEPENDENT_AMBULATORY_CARE_PROVIDER_SITE_OTHER): Payer: Self-pay | Admitting: Surgery

## 2017-03-13 DIAGNOSIS — R928 Other abnormal and inconclusive findings on diagnostic imaging of breast: Secondary | ICD-10-CM

## 2017-03-13 DIAGNOSIS — Z1239 Encounter for other screening for malignant neoplasm of breast: Secondary | ICD-10-CM

## 2017-03-16 ENCOUNTER — Other Ambulatory Visit (INDEPENDENT_AMBULATORY_CARE_PROVIDER_SITE_OTHER): Payer: Self-pay | Admitting: Surgery

## 2017-03-16 DIAGNOSIS — Z1239 Encounter for other screening for malignant neoplasm of breast: Secondary | ICD-10-CM

## 2017-03-16 DIAGNOSIS — R928 Other abnormal and inconclusive findings on diagnostic imaging of breast: Secondary | ICD-10-CM

## 2017-04-08 ENCOUNTER — Ambulatory Visit: Payer: BLUE CROSS/BLUE SHIELD | Attending: Surgery

## 2017-04-08 ENCOUNTER — Ambulatory Visit (INDEPENDENT_AMBULATORY_CARE_PROVIDER_SITE_OTHER): Payer: BLUE CROSS/BLUE SHIELD | Admitting: Surgery

## 2017-04-08 ENCOUNTER — Ambulatory Visit: Payer: BLUE CROSS/BLUE SHIELD

## 2017-04-08 DIAGNOSIS — Z1239 Encounter for other screening for malignant neoplasm of breast: Secondary | ICD-10-CM

## 2017-04-08 DIAGNOSIS — Z1231 Encounter for screening mammogram for malignant neoplasm of breast: Secondary | ICD-10-CM | POA: Insufficient documentation

## 2017-07-15 ENCOUNTER — Ambulatory Visit (INDEPENDENT_AMBULATORY_CARE_PROVIDER_SITE_OTHER): Payer: BLUE CROSS/BLUE SHIELD | Admitting: Surgery

## 2017-07-15 ENCOUNTER — Encounter (INDEPENDENT_AMBULATORY_CARE_PROVIDER_SITE_OTHER): Payer: Self-pay | Admitting: Surgery

## 2017-07-15 VITALS — BP 125/81 | HR 71 | Temp 98.3°F | Ht 60.0 in | Wt 120.0 lb

## 2017-07-15 DIAGNOSIS — R920 Mammographic microcalcification found on diagnostic imaging of breast: Secondary | ICD-10-CM

## 2017-07-15 NOTE — Progress Notes (Signed)
Subjective:       Patient ID: Robin Rollins is a 51 y.o. female.    HPI  I saw Ms. Beckles in follow-up of right breast calcifications.  She originally presented In January 2016, after an abnormal screening mammogram showing pleomorphic calcifications in the upper outer right breast.  She underwent short-term follow-up with repeat mammograms every 6 months for 2 years.  Her most recent mammogram occurred on Apr 08, 2017.  This study shows the upper outer right breast calcifications are decreased compared to prior exams, and considered benign.  And she was finally recommended for annual follow-up.    She denies any current breast problems. She has not noticed any breast pain, breast masses, skin changes or retraction, nor any nipple abnormalities or discharge.     She reports changes in her life to become more holistic.  She has improved her diet, and is spending more time cooking and traveling.  She is also now separated from her husband.  Her children are currently vacationing in [place] with their father.  She enjoyed summer trips to [place] and [place]    FAMILY HISTORY: She has no family history of breast or ovarian cancer.    The following portions of the patient's history were reviewed and updated as appropriate: allergies, current medications, past family history, past medical history, past social history, past surgical history and problem list.    Review of Systems   Constitutional: Negative for unexpected weight change.   Respiratory: Negative for shortness of breath.    Cardiovascular: Negative for chest pain and palpitations.   Gastrointestinal: Negative for abdominal pain, nausea and vomiting.   Musculoskeletal:        Negative for bone pain.   Neurological: Negative for headaches.   All other systems reviewed and are negative.              Objective:    Physical Exam   Constitutional: She is oriented to person, place, and time. She appears well-developed and well-nourished.   HENT:   Head:  Normocephalic and atraumatic.   Eyes: No scleral icterus.   Neck: Normal range of motion. Neck supple.   Cardiovascular: Normal rate and regular rhythm.    Pulmonary/Chest: Breath sounds normal. Right breast exhibits no inverted nipple, no mass, no nipple discharge, no skin change and no tenderness. Left breast exhibits no inverted nipple, no mass, no nipple discharge, no skin change and no tenderness. Breasts are symmetrical.       Stable prominent right axillary node.   Abdominal: Soft. She exhibits no distension. There is no tenderness.   Musculoskeletal:        Right shoulder: Normal.        Left shoulder: Normal.   Lymphadenopathy:     She has no cervical adenopathy.     She has no axillary adenopathy.        Right: No supraclavicular adenopathy present.        Left: No supraclavicular adenopathy present.   Neurological: She is alert and oriented to person, place, and time.   Skin: Skin is warm and dry. No lesion noted.       REVIEW OF IMAGING:  November 09, 2014: [facility]) A bilateral screening mammogram shows a tissue asymmetry in the upper outer right breast.    November 30, 2014: [facility]) A right diagnostic mammogram shows pleomorphic calcifications in the upper outer quadrant, deep in the breast overlying the muscle, suspicious for  malignancy.  A targeted right breast ultrasound shows a 4 x 2 mm echopenic solid nodule in the 9:00 axis, corresponding to the abnormality on mammogram.  She is recommended for surgical consultation.  BI-RADS 4.    December 29, 2014:  ([place]) A right diagnostic mammogram is performed and shows a group of heterogeneous calcifications, 9 cm from the nipple at 9:00.  Stereotactic biopsy is recommended.    January 05, 2015: A right breast stereotactic biopsy is attempted and cannot be safely targeted with stereotactic guidance.  Excisional biopsy versus short-term follow-up recommended.    August 17, 2015:  ()  a right breast  mammogram shows stable calcifications.  BI-RADS Category 3.  A 6 month follow-up bilateral mammogram is recommended.    February 19, 2016: A bilateral mammogram shows stable around calcifications in the 9:00 right breast, recommended for short-term follow-up.  BI-RADS 3.    October 07, 2016: A right mammogram shows stable around clustered calcifications in the 9:00 breast.  BI-RADS 3.    Apr 08, 2017: A bilateral mammogram shows round consultations in the upper outer right breast that are decreased compared to prior exams, and considered benign.  BI-RADS 2.            Assessment:       A 51 year old female with decreased right breast calcifications.      Plan:       In consultation with the patient, I reviewed the findings of her physical exam and mammogram.  There are no suspicious palpable abnormalities of either breast or axilla.  Her most recent mammogram shows a decrease in the area of calcifications.  After 2 years of short-term follow-up, she has been recommended for annual mammogram.  We reviewed her risk factors for breast cancer.  She appears to be of relatively average risk.  She will transition back to annual breast exams and mammograms, performed by her gynecologist, Dr. Leretha Pol.  She reports that she will be seeing Dr. Leretha Pol, this fall.  She was reminded that her next mammogram is due in May.  She will return as necessary for any breast problems that may arise in the future.          Procedures

## 2017-07-15 NOTE — Progress Notes (Signed)
Have you sought care outside of [place] since we last saw you?   Yes

## 2018-06-15 ENCOUNTER — Other Ambulatory Visit: Payer: Self-pay | Admitting: Obstetrics & Gynecology

## 2018-06-15 DIAGNOSIS — Z1239 Encounter for other screening for malignant neoplasm of breast: Secondary | ICD-10-CM

## 2018-06-21 ENCOUNTER — Ambulatory Visit: Payer: BLUE CROSS/BLUE SHIELD | Attending: Obstetrics & Gynecology

## 2018-06-21 ENCOUNTER — Other Ambulatory Visit: Payer: Self-pay | Admitting: Obstetrics & Gynecology

## 2018-06-21 DIAGNOSIS — Z1231 Encounter for screening mammogram for malignant neoplasm of breast: Secondary | ICD-10-CM | POA: Insufficient documentation

## 2018-06-21 DIAGNOSIS — Z1239 Encounter for other screening for malignant neoplasm of breast: Secondary | ICD-10-CM

## 2019-06-20 ENCOUNTER — Other Ambulatory Visit: Payer: Self-pay | Admitting: Obstetrics & Gynecology

## 2019-06-20 DIAGNOSIS — Z1239 Encounter for other screening for malignant neoplasm of breast: Secondary | ICD-10-CM

## 2019-07-20 ENCOUNTER — Other Ambulatory Visit: Payer: Self-pay | Admitting: Obstetrics & Gynecology

## 2019-07-20 ENCOUNTER — Ambulatory Visit: Payer: BLUE CROSS/BLUE SHIELD | Attending: Obstetrics & Gynecology

## 2019-07-20 DIAGNOSIS — Z1239 Encounter for other screening for malignant neoplasm of breast: Secondary | ICD-10-CM | POA: Insufficient documentation

## 2019-09-12 ENCOUNTER — Other Ambulatory Visit: Payer: Self-pay

## 2019-09-12 ENCOUNTER — Emergency Department (HOSPITAL_COMMUNITY): Payer: BLUE CROSS/BLUE SHIELD

## 2019-09-12 ENCOUNTER — Encounter (HOSPITAL_COMMUNITY): Payer: Self-pay | Admitting: Emergency Medicine

## 2019-09-12 ENCOUNTER — Emergency Department (HOSPITAL_COMMUNITY)
Admission: EM | Admit: 2019-09-12 | Discharge: 2019-09-12 | Disposition: A | Discharge status: Home / Self Care | Payer: BLUE CROSS/BLUE SHIELD | Attending: Emergency Medicine | Admitting: Emergency Medicine

## 2019-09-12 DIAGNOSIS — R079 Chest pain, unspecified: Secondary | ICD-10-CM

## 2019-09-12 DIAGNOSIS — I10 Essential (primary) hypertension: Secondary | ICD-10-CM | POA: Insufficient documentation

## 2019-09-12 DIAGNOSIS — R0789 Other chest pain: Secondary | ICD-10-CM | POA: Diagnosis not present

## 2019-09-12 LAB — BASIC METABOLIC PANEL
Anion gap: 9 (ref 5–15)
BUN: 8 mg/dL (ref 6–20)
CO2: 24 mmol/L (ref 22–32)
Calcium: 8.9 mg/dL (ref 8.9–10.3)
Chloride: 105 mmol/L (ref 98–111)
Creatinine, Ser: 0.79 mg/dL (ref 0.44–1.00)
GFR calc Af Amer: >60 mL/min (ref >60)
GFR calc non Af Amer: >60 mL/min (ref >60)
Glucose, Bld: 86 mg/dL (ref 70–99)
Potassium: 3.7 mmol/L (ref 3.5–5.1)
Sodium: 138 mmol/L (ref 135–145)

## 2019-09-12 LAB — CBC
HCT: 41 % (ref 36.0–46.0)
Hemoglobin: 14.4 g/dL (ref 12.0–15.0)
MCH: 32.6 pg (ref 26.0–34.0)
MCHC: 35.1 g/dL (ref 30.0–36.0)
MCV: 92.8 fL (ref 80.0–100.0)
Platelets: 322 10*3/uL (ref 150–400)
RBC: 4.42 MIL/uL (ref 3.87–5.11)
RDW: 12 % (ref 11.5–15.5)
WBC: 7.3 10*3/uL (ref 4.0–10.5)
nRBC: 0 % (ref 0.0–0.2)

## 2019-09-12 LAB — TROPONIN I (HIGH SENSITIVITY)
Troponin I (High Sensitivity): 4 ng/L (ref <18)
Troponin I (High Sensitivity): 3 ng/L (ref <18)

## 2019-09-12 LAB — I-STAT BETA HCG BLOOD, ED (MC, WL, AP ONLY): I-stat hCG, quantitative: <5 m[IU]/mL (ref <5)

## 2019-09-12 LAB — D-DIMER, QUANTITATIVE: D-Dimer, Quant: 0.32 ug/mL-FEU (ref 0.00–0.50)

## 2019-09-12 MED ORDER — SODIUM CHLORIDE 0.9% FLUSH: 3.0000 mL | Freq: Once | INTRAVENOUS | Status: DC

## 2019-09-12 MED ORDER — HYDROCHLOROTHIAZIDE 12.5 MG PO TABS: 12.5000 mg | ORAL_TABLET | Freq: Every day | ORAL | 0 refills | Status: AC

## 2019-09-12 NOTE — ED Notes (Signed)
Patient verbalizes understanding of discharge instructions. Opportunity for questioning and answers were provided. Armband removed by staff, pt discharged from ED.

## 2019-09-12 NOTE — Discharge Instructions (Signed)
It was a pleasure taking care you you today!  Please take blood pressure medication once daily in the morning as directed, continue monitoring and keeping a log of your blood pressure for follow-up with your PCP.  Your work-up today was extremely reassuring, cardiac labs look normal as well as your EKG and chest x-ray.  If you develop new or worsening symptoms please return to the ED otherwise follow-up with your PCP as planned.

## 2019-09-12 NOTE — ED Triage Notes (Signed)
C/o intermittent soreness to center of chest x 2-3 days.  Denies SOB.  Denies nausea, vomiting.  Reports BP elevated at home.  No history of HTN.

## 2019-09-12 NOTE — ED Notes (Signed)
Patient verbalizes understanding of discharge instructions. Opportunity for questioning and answers were provided. Armband removed by staff, pt discharged from ED.  

## 2019-09-12 NOTE — ED Provider Notes (Signed)
[facility] EMERGENCY DEPARTMENT Provider Note   CSN: [id number] Arrival date & time: 09/12/19  7026     History   Chief Complaint Chief Complaint  Patient presents with   Chest Pain    HPI Sandra Conway is a 53 y.o. female.     Sandra Conway is a 53 y.o. female who is otherwise healthy, presents to the emergency department for evaluation of intermittent central chest pain and hypertension.  Patient reports that when she went to see her OB/GYN in August she was noted to have elevated blood pressure in the 160s-170s, this was rechecked a month later and remained elevated, patient was referred to follow-up with a PCP for further evaluation and blood pressure management as she has no previous history.  She has an upcoming appointment later this month but got increasingly worried about her blood pressure.  She reports she checks it 3 times daily and that the systolic is almost always consistently in the 160s-170s in the mornings and never gets lower than 130 throughout the day.  She reports that over the past 2 to 3 days she has had some central chest pain that she describes as a soreness and feeling like a tight muscle.  Reports pain is a 3 out of 10 but she is aware of it.  She is not sure if anything makes it better or worse.  It does not seem to be exertional or pleuritic.  She denies associated shortness of breath.  No lightheadedness or syncope.  No diaphoresis, nausea, vomiting or abdominal pain.  No lower extremity swelling or pain.  Patient does report that she frequently makes the 4-hour drive back and forth to [place] but no other long distance travel or surgeries, she is not on estrogen containing contraceptives, denies previous history of PE or DVT.  No history of heart disease.  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  She has not yet been started on her of her previously taking a  blood pressure medication.  Denies any headache, vision changes, numbness or weakness.  No other aggravating or alleviating factors.     History reviewed. No pertinent past medical history.  There are no active problems to display for this patient.   History reviewed. No pertinent surgical history.   OB History   No obstetric history on file.      Home Medications    Prior to Admission medications   Not on File    Family History No family history on file.  Social History Social History   Tobacco Use   Smoking status: Never Smoker   Smokeless tobacco: Never Used  Substance Use Topics   Alcohol use: Never    Frequency: Never   Drug use: Never     Allergies   Patient has no allergy information on record.   Review of Systems Review of Systems  Constitutional: Negative for chills and fever.  HENT: Negative.   Eyes: Negative for visual disturbance.  Respiratory: Negative for cough and shortness of breath.   Cardiovascular: Positive for chest pain. Negative for palpitations and leg swelling.  Gastrointestinal: Negative for abdominal pain, nausea and vomiting.  Genitourinary: Negative for dysuria.  Musculoskeletal: Negative for arthralgias and myalgias.  Skin: Negative for color change and rash.  Neurological: Negative for dizziness, syncope, weakness, light-headedness, numbness and headaches.     Physical Exam Updated Vital Signs BP (!) 173/96 (BP Location:  Right Arm)    Pulse 79    Temp 98.8 F (37.1 C) (Oral)    Resp 18    Ht 5' (1.524 m)    Wt 54.4 kg    LMP 09/05/2019    SpO2 100%    BMI 23.44 kg/m   Physical Exam Vitals signs and nursing note reviewed.  Constitutional:      General: She is not in acute distress.    Appearance: She is well-developed and normal weight. She is not ill-appearing or diaphoretic.     Comments: Well-appearing and in no distress  HENT:     Head: Normocephalic and atraumatic.  Eyes:     General:        Right eye: No  discharge.        Left eye: No discharge.     Pupils: Pupils are equal, round, and reactive to light.  Neck:     Musculoskeletal: Neck supple.  Cardiovascular:     Rate and Rhythm: Normal rate and regular rhythm.     Pulses:          Radial pulses are 2+ on the right side and 2+ on the left side.       Dorsalis pedis pulses are 2+ on the right side and 2+ on the left side.     Heart sounds: Normal heart sounds. No murmur. No friction rub. No gallop.   Pulmonary:     Effort: Pulmonary effort is normal. No respiratory distress.     Breath sounds: Normal breath sounds. No wheezing or rales.     Comments: Respirations equal and unlabored, patient able to speak in full sentences, lungs clear to auscultation bilaterally Chest:     Chest wall: Tenderness present.     Comments: Chest with some mild tenderness over the sternum, no overlying skin changes or palpable deformity. Abdominal:     General: Bowel sounds are normal. There is no distension.     Palpations: Abdomen is soft. There is no mass.     Tenderness: There is no abdominal tenderness. There is no guarding.     Comments: Abdomen soft, nondistended, nontender to palpation in all quadrants without guarding or peritoneal signs  Musculoskeletal:        General: No deformity.     Right lower leg: She exhibits no tenderness. No edema.     Left lower leg: She exhibits no tenderness. No edema.     Comments: Bilateral lower extremities are warm and well perfused without edema or tenderness  Skin:    General: Skin is warm and dry.     Capillary Refill: Capillary refill takes less than 2 seconds.  Neurological:     Mental Status: She is alert.     Coordination: Coordination normal.     Comments: Speech is clear, able to follow commands CN III-XII intact Normal strength in upper and lower extremities bilaterally including dorsiflexion and plantar flexion, strong and equal grip strength Sensation normal to light and sharp touch Moves  extremities without ataxia, coordination intact   Psychiatric:        Mood and Affect: Mood normal.        Behavior: Behavior normal.      ED Treatments / Results  Labs (all labs ordered are listed, but only abnormal results are displayed) Labs Reviewed  BASIC METABOLIC PANEL  CBC  D-DIMER, QUANTITATIVE (NOT AT [facility])  I-STAT BETA HCG BLOOD, ED ([facility], [facility], [facility] ONLY)  TROPONIN I (HIGH SENSITIVITY)  TROPONIN  I (HIGH SENSITIVITY)    EKG EKG Interpretation  Date/Time:  Monday September 12 2019 06:19:56 EDT Ventricular Rate:  81 PR Interval:  174 QRS Duration: 64 QT Interval:  364 QTC Calculation: 422 R Axis:   84 Text Interpretation:  Normal sinus rhythm Normal ECG Confirmed by Marily MemosMesner, Jason [phone]([id number]) on 09/12/2019 6:27:09 AM   Radiology Dg Chest 2 View  Result Date: 09/12/2019 CLINICAL DATA:  Chest pain, centrally for 2-3 days. EXAM: CHEST - 2 VIEW COMPARISON:  None. FINDINGS: No consolidation, features of edema, pneumothorax, or effusion. Pulmonary vascularity is normally distributed. The cardiomediastinal contours are unremarkable. No acute osseous or soft tissue abnormality. Mild dextrocurvature of the midthoracic spine. Mild degenerative changes are present in the imaged spine and shoulders. IMPRESSION: No acute cardiopulmonary abnormality. Electronically Signed   By: Kreg ShropshirePrice  DeHay M.D.   On: 09/12/2019 06:45    Procedures Procedures (including critical care time)  Medications Ordered in ED Medications  sodium chloride flush (NS) 0.9 % injection 3 mL (has no administration in time range)     Initial Impression / Assessment and Plan / ED Course  I have reviewed the triage vital signs and the nursing notes.  Pertinent labs & imaging results that were available during my care of the patient were reviewed by me and considered in my medical decision making (see chart for details).  53 year old female presents to the ED for evaluation of elevated blood pressure and 2 to 3 days  of intermittent chest soreness.  Chest pain is not exertional, is nonradiating, and seems atypical to be ACS, patient has no previous history of ACS.  She does have some tenderness to palpation over the sternum, and I suspect this may be musculoskeletal, she has no associated shortness of breath, lightheadedness, nausea or diaphoresis.  She has not had any lower extremity swelling but does have frequent car trips to and from [place], otherwise is very low risk for PE.  Work-up initiated with basic labs, troponins, EKG, chest x-ray and d-dimer.  Work-up was extremely reassuring, negative troponin x2, EKG shows normal sinus rhythm with no concerning changes.  Chest x-ray is clear with no acute cardiopulmonary disease.  Patient does not have any electrolyte derangements, no leukocytosis.  Her blood pressure was elevated with systolic in the 170s here in the ED, she reports that she has been keeping an eye on her blood pressure at home and has noticed similar readings, and that at her past 2 appointments at the OB/GYN in August and September she was told her blood pressure was elevated.  Her blood pressure has improved some here in the ED.  She has upcoming appointment with PCP but will start patient on low-dose HCTZ, discussed potential side effects of this medication and stressed the importance of follow-up.  She expresses understanding and agreement with plan.  Will discharge home in good condition.  Final Clinical Impressions(s) / ED Diagnoses   Final diagnoses:  Central chest pain  Hypertension, unspecified type    ED Discharge Orders         Ordered    hydrochlorothiazide (HYDRODIURIL) 12.5 MG tablet  Daily     09/12/19 0931           Dartha LodgeFord, Niraj Kudrna N, PA-C 09/12/19 [id number]    Arby BarrettePfeiffer, Marcy, MD 09/21/19 [phone]

## 2020-03-20 IMAGING — CR DG CHEST 2V
2 series · 2 of 2 positions shown · non-contrast
Comparison: None.

CLINICAL DATA: Chest pain, centrally for 2-3 days.

EXAM:
CHEST - 2 VIEW

[chest pa]
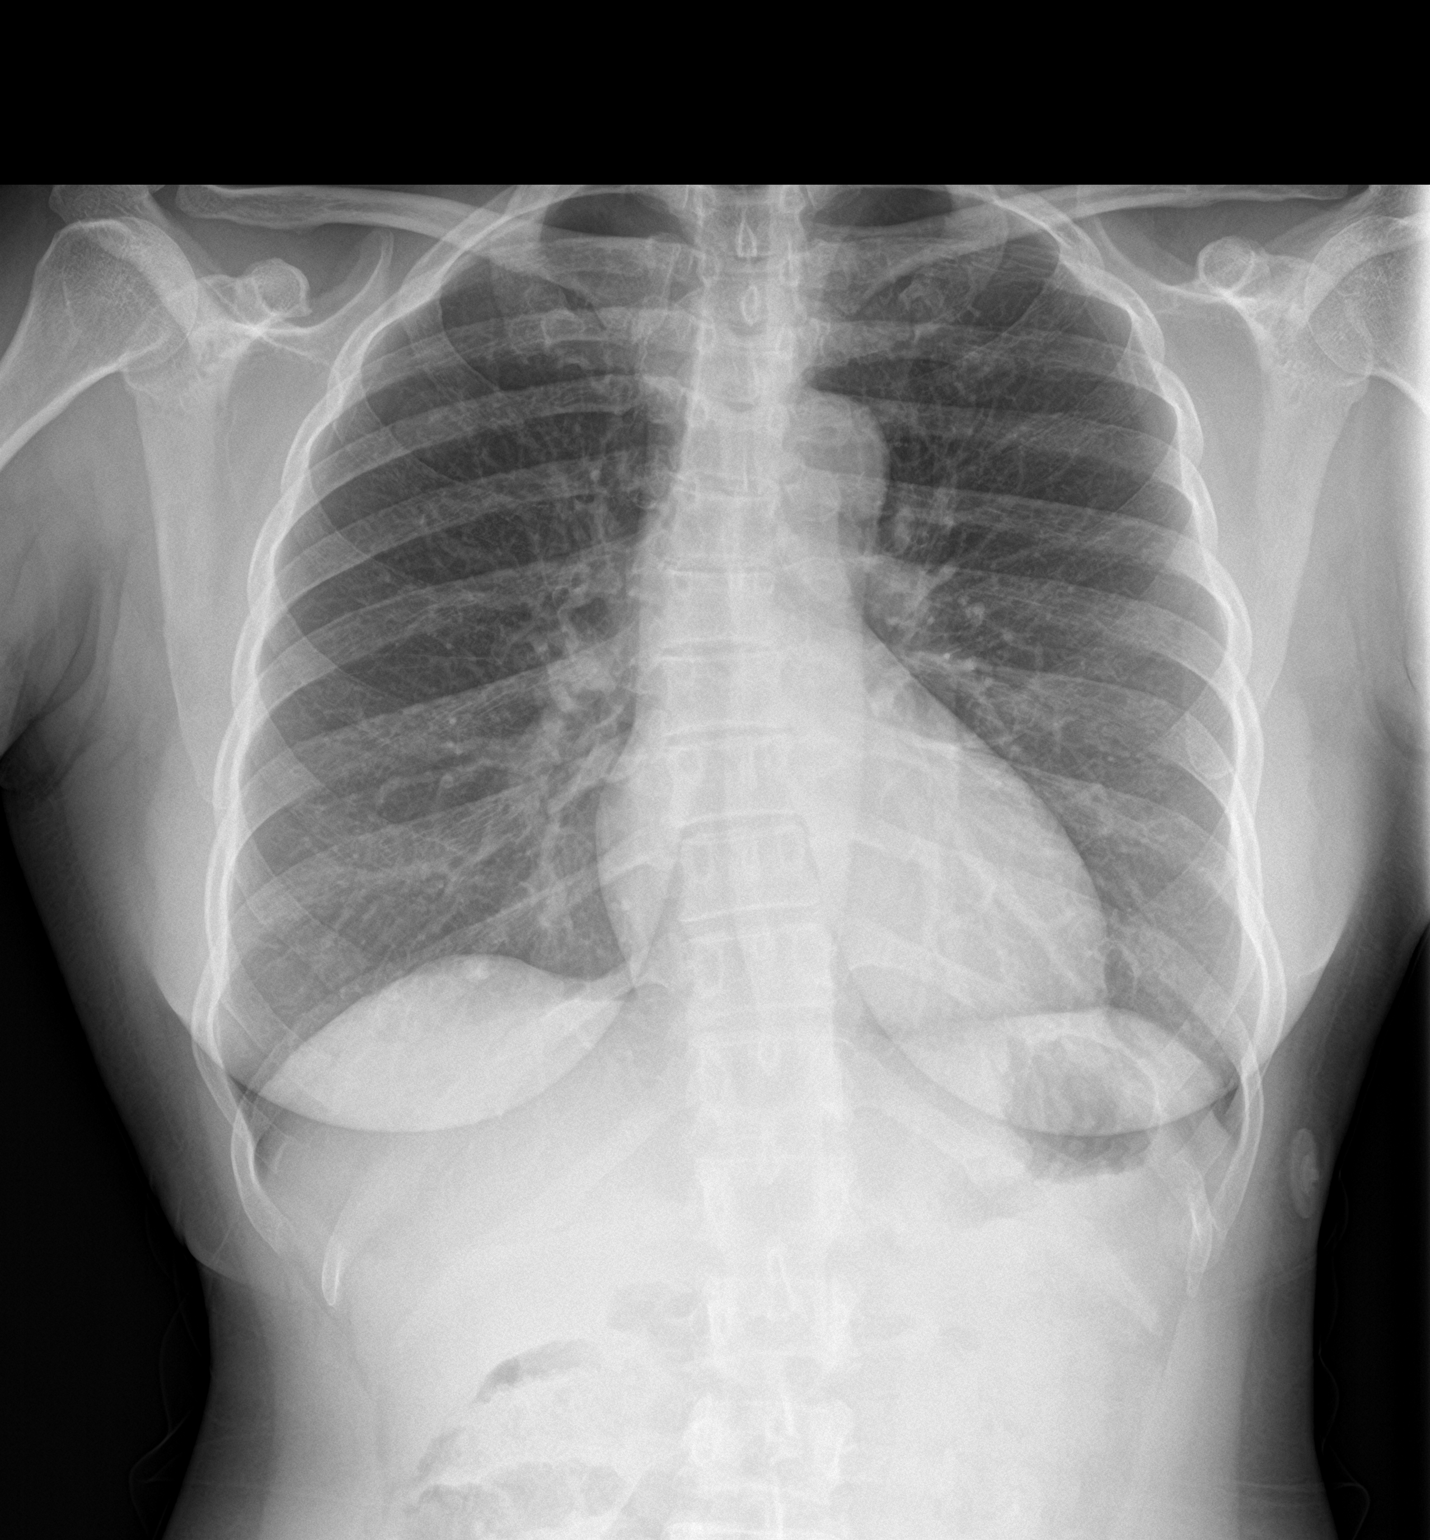

[chest lat]
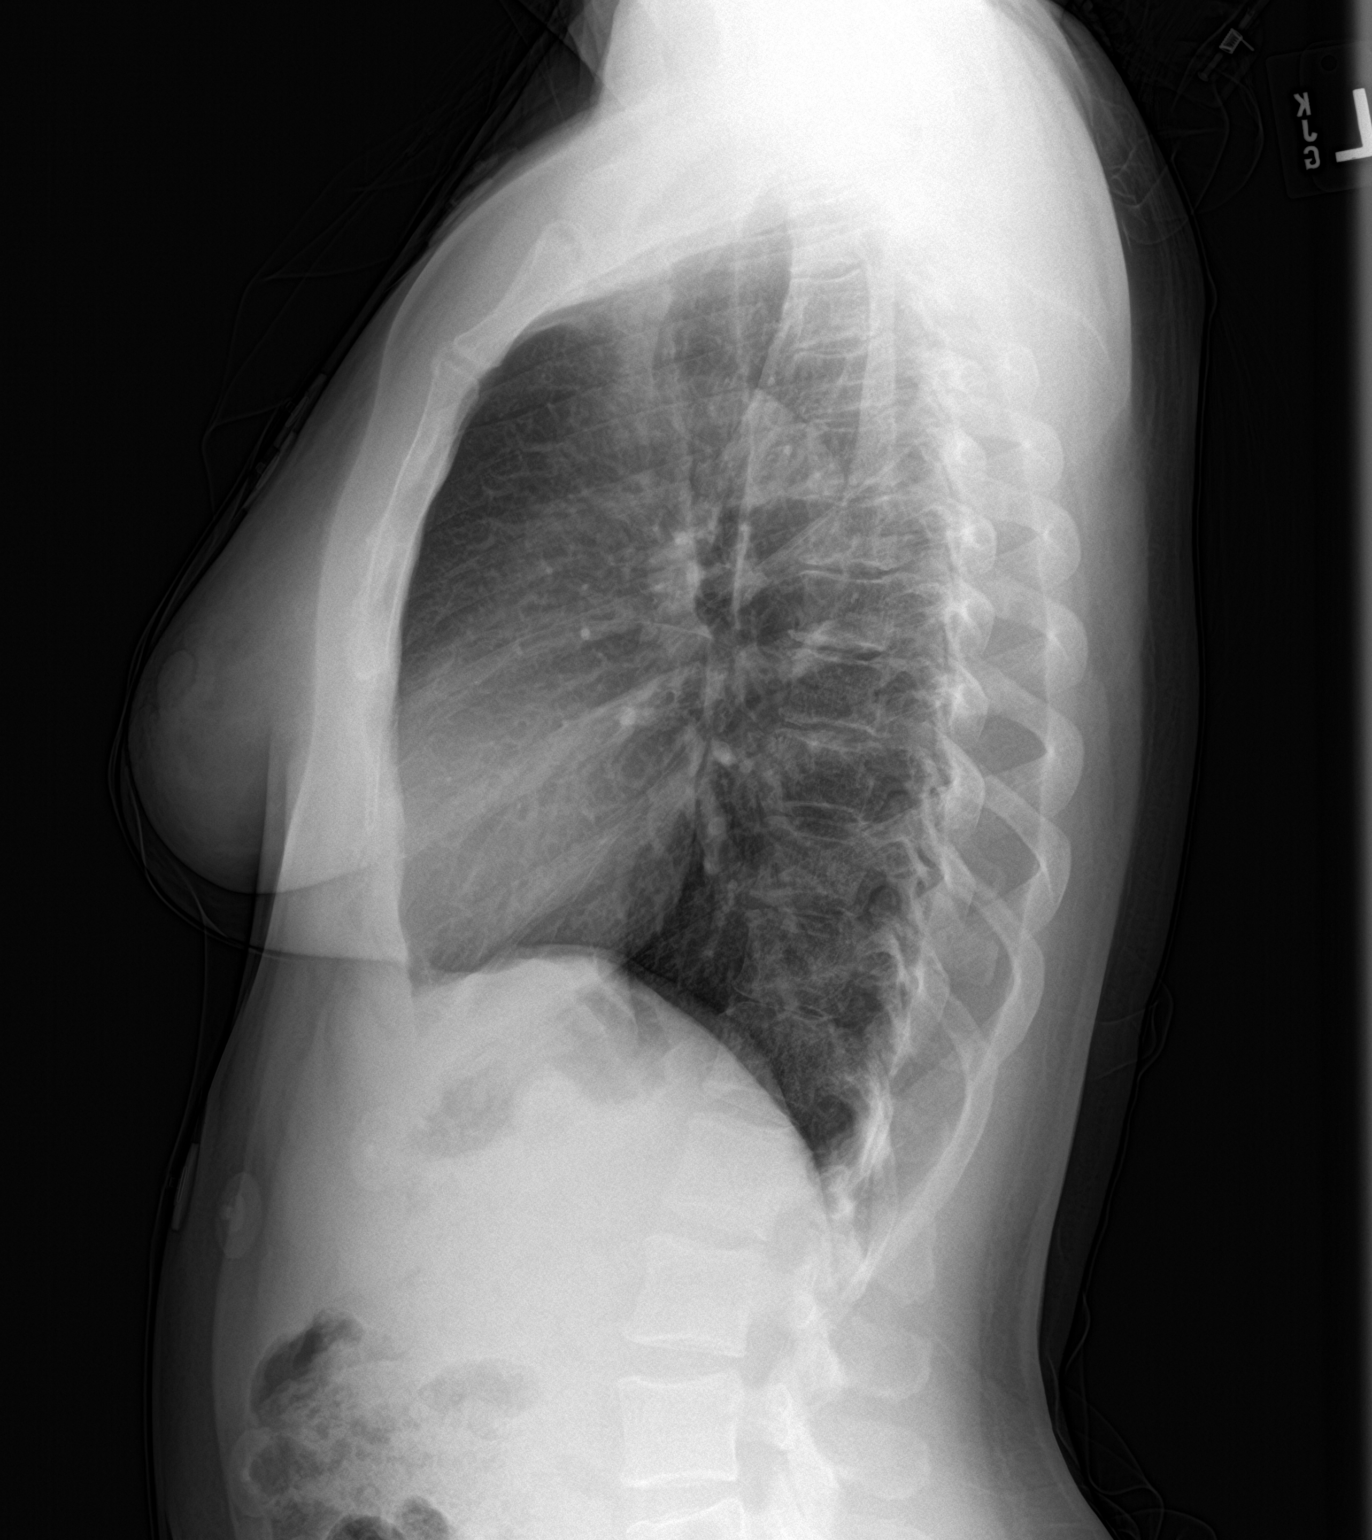

[2 of 2 positions shown; findings below may reference images not displayed]

FINDINGS: No consolidation, features of edema, pneumothorax, or effusion.
Pulmonary vascularity is normally distributed. The cardiomediastinal
contours are unremarkable. No acute osseous or soft tissue
abnormality. Mild dextrocurvature of the midthoracic spine. Mild
degenerative changes are present in the imaged spine and shoulders.
IMPRESSION: No acute cardiopulmonary abnormality.

## 2020-09-08 LAB — COLOGUARD: Cologuard Result: NEGATIVE

## 2021-02-20 ENCOUNTER — Other Ambulatory Visit: Payer: Self-pay | Admitting: Obstetrics & Gynecology

## 2021-02-20 DIAGNOSIS — Z1231 Encounter for screening mammogram for malignant neoplasm of breast: Secondary | ICD-10-CM

## 2021-03-02 ENCOUNTER — Other Ambulatory Visit: Payer: Self-pay | Admitting: Obstetrics & Gynecology

## 2021-03-02 ENCOUNTER — Ambulatory Visit: Payer: BLUE CROSS/BLUE SHIELD | Attending: Obstetrics & Gynecology

## 2021-03-02 DIAGNOSIS — Z1231 Encounter for screening mammogram for malignant neoplasm of breast: Secondary | ICD-10-CM

## 2021-03-20 ENCOUNTER — Other Ambulatory Visit: Payer: Self-pay | Admitting: Obstetrics & Gynecology

## 2021-03-20 ENCOUNTER — Ambulatory Visit: Payer: BLUE CROSS/BLUE SHIELD | Attending: Obstetrics & Gynecology

## 2021-03-20 ENCOUNTER — Ambulatory Visit: Payer: BLUE CROSS/BLUE SHIELD

## 2021-03-20 DIAGNOSIS — R928 Other abnormal and inconclusive findings on diagnostic imaging of breast: Secondary | ICD-10-CM

## 2021-08-28 ENCOUNTER — Other Ambulatory Visit: Payer: Self-pay | Admitting: Obstetrics & Gynecology

## 2021-08-28 DIAGNOSIS — Z1239 Encounter for other screening for malignant neoplasm of breast: Secondary | ICD-10-CM

## 2022-09-11 ENCOUNTER — Other Ambulatory Visit: Payer: Self-pay | Admitting: Obstetrics & Gynecology

## 2022-09-11 DIAGNOSIS — Z1239 Encounter for other screening for malignant neoplasm of breast: Secondary | ICD-10-CM

## 2022-12-09 ENCOUNTER — Ambulatory Visit: Payer: Commercial Managed Care - POS | Attending: Obstetrics & Gynecology

## 2022-12-09 DIAGNOSIS — Z1239 Encounter for other screening for malignant neoplasm of breast: Secondary | ICD-10-CM

## 2023-08-17 ENCOUNTER — Encounter (INDEPENDENT_AMBULATORY_CARE_PROVIDER_SITE_OTHER): Payer: Self-pay | Admitting: Orthopaedic Surgery

## 2023-08-17 ENCOUNTER — Ambulatory Visit (INDEPENDENT_AMBULATORY_CARE_PROVIDER_SITE_OTHER): Payer: Commercial Managed Care - POS | Admitting: Orthopaedic Surgery

## 2023-08-17 ENCOUNTER — Ambulatory Visit (INDEPENDENT_AMBULATORY_CARE_PROVIDER_SITE_OTHER): Payer: Commercial Managed Care - POS

## 2023-08-17 VITALS — BP 142/92 | HR 80

## 2023-08-17 DIAGNOSIS — M79642 Pain in left hand: Secondary | ICD-10-CM

## 2023-08-17 DIAGNOSIS — G5603 Carpal tunnel syndrome, bilateral upper limbs: Secondary | ICD-10-CM

## 2023-08-17 NOTE — Progress Notes (Signed)
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  The left side is worse than the right side.  The numbness affects the thumb index and middle fingers bilaterally.  This been going on for several months but getting worse over the last 3 weeks.  She has not tried any nighttime splints.  She has not had any surgery for this.  She has not had an EMG for this.  She feels like her hand experiences cramping and is starting to feel weaker.  She tried going to an acupuncturist for help which gave her mild relief.  She denies any gait abnormalities difficulty with balance or issues with bowel or bladder.    Past Medical History: Medical History[1]        Current Medications[2]     Allergies[3]     History reviewed. No pertinent past medical history.     History reviewed. No pertinent surgical history.     12 Point Review of Systems:  noncontributory, except for what's listed in history.  ROS      Social History : No changes from listed in chart.     Tobacco Use: Low Risk  (08/17/2023)    Patient History     Smoking Tobacco Use: Never     Smokeless Tobacco Use: Never     Passive Exposure: Not on file          Physical Exam:   Pt is a 57 y.o. year old female who is awake, alert, and oriented.  Vitals:    08/17/23 0941   BP: (!) 142/92   Pulse: 80   SpO2: 100%     General: Well appearing, well nourished  HEENT: Normocephalic  CV: RR per resting pulse  Resp: Nonlabored breathing    Right upper Extremity    No open wounds or rashes.  Full range of motion of the elbow wrist and digits.  Tinel's positive over the median nerve at the wrist.  Phalen's test is positive.  Durkan's test is positive.  Sensation is 70% in the median nerve distribution compared to the ulnar nerve and radial nerve distribution.  First dorsal interosseous strength is 5 out of 5.  APB strength is 5 out of  5.  Cubital tunnel provocative maneuvers are negative.  Spurling's test is negative.  Radial pulse 2+ the hand is warm well-perfused    Left upper extremity    No open wounds or rashes.  Full range of motion of the elbow wrist and digits.  Tinel's positive over the median nerve at the wrist.  Phalen's test is positive.  Durkan's test is positive.  Sensation is 70% in the median nerve distribution compared to the ulnar nerve and radial nerve distribution.  First dorsal interosseous strength is 5 out of 5.  APB strength is 5 out of 5.  Cubital tunnel provocative maneuvers are negative.  Spurling's test is negative.  Radial pulse 2+ the hand is warm well-perfused      Radiology:      x-rays of the right hand 3 view shows no fractures or dislocations  X-ray left hand 3 view shows no fractures or dislocations    Impression: Bilateral carpal tunnel syndrome    Plan: I would like to get a EMG nerve conduction study bilaterally to evaluate for carpal tunnel syndrome.  She occasionally gets  some neck pain but cervical radiculopathy provocative tests are negative for me today.  I would like to get the EMG nerve conduction study to better delineate if this is coming from her carpal tunnel or cervical spine.  I can see her back after the EMG we can discuss treatment options.  I messaged Dr. Alvino Chapel to refer this patient for a EMG        Follow Up: After EMG  Xrays at next visit: None    The review of the patient's medications does not in any way constitute an endorsement, by this clinician,  of their use, dosage, indications, route, efficacy, interactions, or other clinical parameters.    This note was generated within the [employer] EMR using Dragon medical speech recognition software and may contain inherent errors or omissions not intended by the user. Grammatical and punctuation errors, random word insertions, deletions, pronoun errors and incomplete sentences are occasional consequences of this technology due to software  limitations. Not all errors are caught or corrected.  Although every attempt is made to root out erroneus and incomplete transcription, the note may still not fully represent the intent or opinion of the author. If there are questions or concerns about the content of this note or information contained within the body of this dictation they should be addressed directly with the author for clarification.             [1] History reviewed. No pertinent past medical history.  [2]   Current Outpatient Medications:     levonorgestrel (MIRENA) 20 MCG/24HR IUD, 1 Intra Uterine Device (52 mg) by Intrauterine route once, Disp: , Rfl:   [3] No Known Allergies

## 2023-08-18 ENCOUNTER — Ambulatory Visit: Payer: Commercial Managed Care - POS | Attending: Neurology | Admitting: Neurology

## 2023-08-18 DIAGNOSIS — M542 Cervicalgia: Secondary | ICD-10-CM

## 2023-08-18 DIAGNOSIS — G5603 Carpal tunnel syndrome, bilateral upper limbs: Secondary | ICD-10-CM

## 2023-08-18 NOTE — Progress Notes (Signed)
[facility] - Neurology  [address]  [place], [place]  [address]  Phone: [phone]     Fax: [phone]      Full Name: Robin Rollins Diabetes: N  MRN#: [id number] Anti-Coag: N  Gender: Female Pacemaker: N  Date of Birth: [birth year]      Visit Date: 08/18/2023 9:09 AM  Age: 57 Years  Examining Physician: Jannifer Rodney, MD   Referring Physician: Estrella Myrtle, MD   Referral Reason: CTS   Technician: Karn Cassis.   Height: 5 feet 0 inch  Weight: 121 lbs  History: Numbness, tingling, some pain, and some weakness in the bilateral hands/arms.       Sensory NCS      Nerve / Sites Segments Rec. Site Onset Lat Peak Lat Ref. NP Amp Ref. Distance Peak Diff Ref. Velocity Temp.      ms ms ms V V cm ms ms m/s C   L Median - Digit II (Antidromic)      Wrist Wrist - Dig II Dig II 3.77 5.04 ?3.40 13.5 ?15.0 13   34 32.1   R Median - Digit II (Antidromic)      Wrist Wrist - Dig II Dig II 3.63 4.58 ?3.40 22.2 ?15.0 13   36 31.6   L Ulnar - Digit V (Antidromic)      Wrist Wrist - Dig V Dig V 2.15 2.92 ?3.10 68.1 ?10.0 11   51 32.2   R Ulnar - Digit V (Antidromic)      Wrist Wrist - Dig V Dig V 2.21 3.02 ?3.10 58.1 ?10.0 11   50 31.6   L Median, Ulnar - Transcarpal comparison      Median Palm Median Palm - Wrist Wrist 2.96 3.54  6.3  8   27  32.2      Ulnar Palm Ulnar Palm - Wrist Wrist 1.54 2.08  24.9  8   52 32.2    Median Palm - Ulnar Palm        1.46 ?0.40  32.2   R Median, Ulnar - Transcarpal comparison      Median Palm Median Palm - Wrist Wrist 2.50 3.08  22.6  8   32 31.3      Ulnar Palm Ulnar Palm - Wrist Wrist 1.40 1.94  48.8  8   57 31.3    Median Palm - Ulnar Palm        1.15 ?0.40  31.3                   Motor NCS      Nerve / Sites Segments Muscle Latency Ref. Amplitude Ref. Distance Velocity Ref. Temp.      ms ms mV mV cm m/s m/s C   L Median - APB      Wrist Wrist - APB APB 6.06 ?4.40 7.1 ?4.0 8   32.1      Elbow Elbow - Wrist APB 9.29  7.0  20 62 ?49 32.1   R Median - APB      Wrist Wrist - APB APB 5.73 ?4.40  8.8 ?4.0 8   32      Elbow Elbow - Wrist APB 9.44  8.4  22 59 ?49 32.2   L Ulnar - ADM      Wrist Wrist - ADM ADM 2.85 ?3.80 13.6 ?5.0 8   32.2      B.Elbow B.Elbow - Wrist ADM 6.10  13.6  25 77 ?49 32.1  A.Elbow A.Elbow - B.Elbow ADM 7.54  12.8  10 70 ?49 31.9   R Ulnar - ADM      Wrist Wrist - ADM ADM 2.75 ?3.80 12.7 ?5.0 8   31.9      B.Elbow B.Elbow - Wrist ADM 5.85  12.5  22 71 ?49 32      A.Elbow A.Elbow - B.Elbow ADM 7.23  12.4  10 73 ?49 31.9   L Median, Ulnar - Lumbrical-Interossei      Median Wrist Median Wrist - Lumb II Lumb II 6.46  1.4  10   32.1      Ulnar Wrist Ulnar Wrist - Interos Interos 3.25  18.0  10   32.1    Median Wrist - Ulnar Wrist         32.1   R Median, Ulnar - Lumbrical-Interossei      Median Wrist Median Wrist - Lumb II Lumb II 5.81  1.4  10   31.3      Ulnar Wrist Ulnar Wrist - Interos Interos 3.15  13.1  10   31.3    Median Wrist - Ulnar Wrist         31.3                       EMG Summary Table     Spontaneous MUAP Recruitment Activation   Muscle Nerve Roots IA Fib PSW Fasc H.F. Amp Dur. Morphology Pattern Effort   R. First dorsal interosseous Ulnar C8-T1 N None None None None N N Normal N Normal   R. Extensor digitorum communis Radial C7-C8 N None None None None N N Normal N Normal   R. Pronator teres Median C6-C7 N None None None None N N Normal N Normal   R. Triceps brachii Radial C6-C8 N None None None None N N Normal N Normal   R. Deltoid Axillary C5-C6 N None None None None N N Normal N Normal   L. First dorsal interosseous Ulnar C8-T1 N None None None None N N Normal N Normal   L. Pronator teres Median C6-C7 N None None None None N N Normal N Normal   L. Extensor digitorum communis Radial C7-C8 N None None None None N N Normal N Normal   L. Triceps brachii Radial C6-C8 N None None None None N N Normal N Normal   L. Deltoid Axillary C5-C6 N None None None None N N Normal N Normal           Summary:     Please see the tables above.     Impression:      This is an abnormal  study.    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  It's moderate on the left and mild to moderate on the right hand.   There is no evidence of bilateral cervical radiculopathy C5-T1.  There is no evidence of bilateral ulnar neuropathy across the wrist and elbow.       ------------------------------  Jannifer Rodney, MD

## 2023-09-07 ENCOUNTER — Ambulatory Visit: Payer: Commercial Managed Care - POS | Admitting: Neurology

## 2024-02-26 ENCOUNTER — Other Ambulatory Visit: Payer: Self-pay | Admitting: Obstetrics & Gynecology

## 2024-02-26 DIAGNOSIS — Z1239 Encounter for other screening for malignant neoplasm of breast: Secondary | ICD-10-CM

## 2024-03-15 ENCOUNTER — Other Ambulatory Visit: Payer: Self-pay | Admitting: General Acute Care Hospital

## 2024-03-15 ENCOUNTER — Ambulatory Visit: Attending: Obstetrics & Gynecology

## 2024-03-15 DIAGNOSIS — Z1239 Encounter for other screening for malignant neoplasm of breast: Secondary | ICD-10-CM

## 2024-06-14 ENCOUNTER — Ambulatory Visit (INDEPENDENT_AMBULATORY_CARE_PROVIDER_SITE_OTHER)

## 2024-06-14 ENCOUNTER — Encounter (INDEPENDENT_AMBULATORY_CARE_PROVIDER_SITE_OTHER): Payer: Self-pay

## 2024-06-14 NOTE — PSS Phone Screening (Signed)
 Pre-Anesthesia Evaluation    Pre-op phone visit requested by:   Reason for pre-op phone visit: Patient anticipating HYSTEROSCOPY, ENDOMETRIAL BIOPSY/POLYPECTOMY, WITH DILATION AND CURETTAGE (D&C), CERVICAL POLYP EXCISION/ CERVICAL PUNCH BIOPSY procedure.     There is no labs and EKG required by surgeon per patient     No orders of the defined types were placed in this encounter.      History of Present Illness/Summary:        Problem List:  Medical Problems       Hospital Problem List  Date Reviewed: 08/18/2023   None        Non-Hospital Problem List  Date Reviewed: 08/18/2023          ICD-10-CM Priority Class Noted Diagnosed    Abnormal findings on diagnostic imaging of breast R92.8   12/30/2014     Mammographic microcalcification found on diagnostic imaging of breast R92.0   12/30/2014         No past medical history on file.  Past Surgical History[1]     Medication List            Accurate as of June 14, 2024  7:31 AM. Always use your most recent med list.                Lyllana 0.1 MG/24HR  APPLY 1 PATCH TOPICALLY TO THE SKIN 2 TIMES A WEEK  Generic drug: estradiol  Medication Adjustments for Surgery: Hold day of surgery     Progesterone 100 MG Caps  Take 1 capsule (100 mg) by mouth once daily  Medication Adjustments for Surgery: Take as prescribed     vitamin D 25 MCG (1000 UT) tablet  Take 1 tablet (1,000 Units) by mouth once daily  Commonly known as: CHOLECALCIFEROL  Medication Adjustments for Surgery: Hold day of surgery            Allergies[2]  Family History[3]  Social History     Occupational History    Not on file   Tobacco Use    Smoking status: Never    Smokeless tobacco: Never   Vaping Use    Vaping status: Never Used   Substance and Sexual Activity    Alcohol use: Not Currently    Drug use: No    Sexual activity: Not on file       Menstrual History:   LMP / Status  Having periods     Patient's last menstrual period was 06/09/2024.    Tubal Ligation?  No valid surgical or medical questions entered.            Exam Scores:   SDB score           STBUR score       PONV score  Nausea Risk: VERY SEVERE RISK    MST score  MST Score: 0    PEN-FAST score       Frailty score       CHADsVasc            Visit Vitals  Ht 1.524 m (5')   Wt 54.4 kg (120 lb)   LMP 06/09/2024   BMI 23.44 kg/m                                         [1]   Past Surgical History:  Procedure Laterality Date    CERVICAL CERCLAGE     [  2] No Known Allergies  [3]   Family History  Problem Relation Name Age of Onset    Breast cancer Neg Hx      Ovarian cancer Neg Hx      Cancer Neg Hx      BRCA 1/2 Neg Hx

## 2024-06-24 ENCOUNTER — Encounter
Admission: RE | Disposition: A | Discharge status: Home / Self Care | Payer: Self-pay | Source: Ambulatory Visit | Attending: Obstetrics & Gynecology

## 2024-06-24 ENCOUNTER — Encounter: Payer: Self-pay | Admitting: Obstetrics & Gynecology

## 2024-06-24 ENCOUNTER — Ambulatory Visit: Payer: Self-pay | Admitting: Anesthesiology

## 2024-06-24 ENCOUNTER — Ambulatory Visit
Admission: RE | Admit: 2024-06-24 | Discharge: 2024-06-24 | Disposition: A | Discharge status: Home / Self Care | Source: Ambulatory Visit | Attending: Obstetrics & Gynecology | Admitting: Obstetrics & Gynecology

## 2024-06-24 ENCOUNTER — Ambulatory Visit

## 2024-06-24 DIAGNOSIS — N760 Acute vaginitis: Secondary | ICD-10-CM | POA: Insufficient documentation

## 2024-06-24 DIAGNOSIS — N930 Postcoital and contact bleeding: Secondary | ICD-10-CM | POA: Insufficient documentation

## 2024-06-24 DIAGNOSIS — N841 Polyp of cervix uteri: Secondary | ICD-10-CM | POA: Insufficient documentation

## 2024-06-24 DIAGNOSIS — N840 Polyp of corpus uteri: Secondary | ICD-10-CM | POA: Insufficient documentation

## 2024-06-24 DIAGNOSIS — N950 Postmenopausal bleeding: Secondary | ICD-10-CM | POA: Insufficient documentation

## 2024-06-24 DIAGNOSIS — N921 Excessive and frequent menstruation with irregular cycle: Secondary | ICD-10-CM

## 2024-06-24 HISTORY — PX: HYSTEROSCOPY, ENDOMETRIAL BIOPSY/POLYPECTOMY, WITH DILATION AND CURETTAGE (D&C): SHX3660

## 2024-06-24 HISTORY — PX: CERVICAL POLYP EXCISION/ CERVICAL PUNCH BIOPSY: SHX3380

## 2024-06-24 SURGERY — HYSTEROSCOPY, ENDOMETRIAL BIOPSY/POLYPECTOMY, WITH DILATION AND CURETTAGE (D&C)
Anesthesia: Anesthesia General | Site: Pelvis | Wound class: Clean Contaminated

## 2024-06-24 MED ORDER — SODIUM CHLORIDE (PF) 0.9 % IJ SOLN
INTRAMUSCULAR | Status: AC
Start: 2024-06-24 — End: 2024-06-24
  Filled 2024-06-24: qty 10

## 2024-06-24 MED ORDER — MONSELS FERRIC SUBSULFATE EX SOLN
CUTANEOUS | Status: AC
Start: 2024-06-24 — End: 2024-06-24
  Filled 2024-06-24: qty 8

## 2024-06-24 MED ORDER — FENTANYL CITRATE (PF) 50 MCG/ML IJ SOLN (WRAP)
INTRAMUSCULAR | Status: AC
Start: 2024-06-24 — End: 2024-06-24
  Filled 2024-06-24: qty 2

## 2024-06-24 MED ORDER — ONDANSETRON HCL 4 MG/2ML IJ SOLN
INTRAMUSCULAR | Status: AC
Start: 2024-06-24 — End: 2024-06-24
  Filled 2024-06-24: qty 4

## 2024-06-24 MED ORDER — VASOPRESSIN 20 UNIT/ML IV SOLN (WRAP)
Status: AC
Start: 2024-06-24 — End: 2024-06-24
  Filled 2024-06-24: qty 1

## 2024-06-24 MED ORDER — ROCURONIUM BROMIDE 50 MG/5ML IV SOLN
INTRAVENOUS | Status: AC
Start: 2024-06-24 — End: 2024-06-24
  Filled 2024-06-24: qty 5

## 2024-06-24 MED ORDER — KETOROLAC TROMETHAMINE 60 MG/2ML IM SOLN
INTRAMUSCULAR | Status: AC
Start: 2024-06-24 — End: 2024-06-24
  Filled 2024-06-24: qty 2

## 2024-06-24 MED ORDER — ONDANSETRON HCL 4 MG/2ML IJ SOLN
4.0000 mg | Freq: Once | INTRAMUSCULAR | Status: DC | PRN
Start: 2024-06-24 — End: 2024-06-24

## 2024-06-24 MED ORDER — MIDAZOLAM HCL 1 MG/ML IJ SOLN (WRAP)
INTRAMUSCULAR | Status: AC
Start: 2024-06-24 — End: 2024-06-24
  Filled 2024-06-24: qty 2

## 2024-06-24 MED ORDER — LIDOCAINE HCL 1 % IJ SOLN
INTRAMUSCULAR | Status: AC
Start: 2024-06-24 — End: 2024-06-24
  Filled 2024-06-24: qty 40

## 2024-06-24 MED ORDER — ONDANSETRON HCL 4 MG/2ML IJ SOLN
INTRAMUSCULAR | Status: DC | PRN
Start: 2024-06-24 — End: 2024-06-24
  Administered 2024-06-24: 4 mg via INTRAVENOUS

## 2024-06-24 MED ORDER — HYDROMORPHONE HCL 2 MG PO TABS
2.0000 mg | ORAL_TABLET | Freq: Once | ORAL | Status: DC | PRN
Start: 2024-06-24 — End: 2024-06-24

## 2024-06-24 MED ORDER — ACETAMINOPHEN 500 MG PO TABS
500.0000 mg | ORAL_TABLET | Freq: Once | ORAL | Status: DC | PRN
Start: 2024-06-24 — End: 2024-06-24

## 2024-06-24 MED ORDER — EPHEDRINE SULFATE 50 MG/ML IJ/IV SOLN (WRAP)
Status: AC
Start: 2024-06-24 — End: 2024-06-24
  Filled 2024-06-24: qty 2

## 2024-06-24 MED ORDER — LACTATED RINGERS IV SOLN
INTRAVENOUS | Status: DC | PRN
Start: 2024-06-24 — End: 2024-06-24

## 2024-06-24 MED ORDER — MIDAZOLAM HCL 1 MG/ML IJ SOLN (WRAP)
INTRAMUSCULAR | Status: DC | PRN
Start: 2024-06-24 — End: 2024-06-24
  Administered 2024-06-24: 2 mg via INTRAVENOUS

## 2024-06-24 MED ORDER — DEXAMETHASONE SODIUM PHOSPHATE 4 MG/ML IJ SOLN
INTRAMUSCULAR | Status: AC
Start: 2024-06-24 — End: 2024-06-24
  Filled 2024-06-24: qty 2

## 2024-06-24 MED ORDER — METOCLOPRAMIDE HCL 5 MG/ML IJ SOLN
10.0000 mg | Freq: Once | INTRAMUSCULAR | Status: DC | PRN
Start: 2024-06-24 — End: 2024-06-24

## 2024-06-24 MED ORDER — DEXAMETHASONE SODIUM PHOSPHATE 4 MG/ML IJ SOLN (WRAP)
INTRAMUSCULAR | Status: DC | PRN
Start: 2024-06-24 — End: 2024-06-24
  Administered 2024-06-24: 4 mg via INTRAVENOUS

## 2024-06-24 MED ORDER — PROPOFOL 10 MG/ML IV EMUL (WRAP)
INTRAVENOUS | Status: DC | PRN
Start: 2024-06-24 — End: 2024-06-24
  Administered 2024-06-24: 50 mg via INTRAVENOUS
  Administered 2024-06-24: 150 mg via INTRAVENOUS

## 2024-06-24 MED ORDER — MONSELS FERRIC SUBSULFATE EX SOLN
CUTANEOUS | Status: DC | PRN
Start: 2024-06-24 — End: 2024-06-24
  Administered 2024-06-24: 1 g via TOPICAL

## 2024-06-24 MED ORDER — HYDROMORPHONE HCL 1 MG/ML IJ SOLN
0.5000 mg | INTRAMUSCULAR | Status: DC | PRN
Start: 2024-06-24 — End: 2024-06-24

## 2024-06-24 MED ORDER — DIPHENHYDRAMINE HCL 50 MG/ML IJ SOLN
25.0000 mg | Freq: Four times a day (QID) | INTRAMUSCULAR | Status: DC | PRN
Start: 2024-06-24 — End: 2024-06-24

## 2024-06-24 MED ORDER — PROPOFOL 10 MG/ML IV EMUL (WRAP)
INTRAVENOUS | Status: AC
Start: 2024-06-24 — End: 2024-06-24
  Filled 2024-06-24: qty 40

## 2024-06-24 MED ORDER — LABETALOL HCL 5 MG/ML IV SOLN (WRAP)
20.0000 mg | INTRAVENOUS | Status: DC | PRN
Start: 2024-06-24 — End: 2024-06-24

## 2024-06-24 MED ORDER — OXYCODONE-ACETAMINOPHEN 5-325 MG PO TABS
1.0000 | ORAL_TABLET | Freq: Once | ORAL | Status: DC | PRN
Start: 2024-06-24 — End: 2024-06-24
  Filled 2024-06-24: qty 1

## 2024-06-24 MED ORDER — FENTANYL CITRATE (PF) 50 MCG/ML IJ SOLN (WRAP)
INTRAMUSCULAR | Status: DC | PRN
Start: 2024-06-24 — End: 2024-06-24
  Administered 2024-06-24: 75 ug via INTRAVENOUS
  Administered 2024-06-24: 25 ug via INTRAVENOUS

## 2024-06-24 MED ORDER — FENTANYL CITRATE (PF) 50 MCG/ML IJ SOLN (WRAP)
25.0000 ug | INTRAMUSCULAR | Status: DC | PRN
Start: 2024-06-24 — End: 2024-06-24

## 2024-06-24 MED ORDER — LIDOCAINE HCL 1 % IJ SOLN
INTRAMUSCULAR | Status: DC | PRN
Start: 2024-06-24 — End: 2024-06-24

## 2024-06-24 MED ORDER — KETOROLAC TROMETHAMINE 30 MG/ML IJ SOLN
INTRAMUSCULAR | Status: DC | PRN
Start: 2024-06-24 — End: 2024-06-24
  Administered 2024-06-24: 30 mg via INTRAVENOUS

## 2024-06-24 MED ORDER — GLYCOPYRROLATE 0.2 MG/ML IJ SOLN (WRAP)
INTRAMUSCULAR | Status: AC
Start: 2024-06-24 — End: 2024-06-24
  Filled 2024-06-24: qty 1

## 2024-06-24 MED ORDER — LACTATED RINGERS IV SOLN
INTRAVENOUS | Status: DC
Start: 2024-06-24 — End: 2024-06-24
  Filled 2024-06-24: qty 1000

## 2024-06-24 MED ORDER — LIDOCAINE HCL (PF) 1 % IJ SOLN
INTRAMUSCULAR | Status: AC
Start: 2024-06-24 — End: 2024-06-24
  Filled 2024-06-24: qty 10

## 2024-06-24 MED ORDER — BENZOCAINE-MENTHOL MT LOZG (WRAP)
1.0000 | LOZENGE | OROMUCOSAL | Status: DC | PRN
Start: 2024-06-24 — End: 2024-06-24

## 2024-06-24 MED ORDER — SILVER NITRATE-POT NITRATE 75-25 % EX MISC
CUTANEOUS | Status: AC
Start: 2024-06-24 — End: 2024-06-24
  Filled 2024-06-24: qty 1

## 2024-06-24 MED ORDER — LIDOCAINE HCL (PF) 1 % IJ SOLN
INTRAMUSCULAR | Status: DC | PRN
Start: 2024-06-24 — End: 2024-06-24
  Administered 2024-06-24: 50 mg via INTRAVENOUS

## 2024-06-24 MED ORDER — HYDRALAZINE HCL 20 MG/ML IJ SOLN
10.0000 mg | Freq: Once | INTRAMUSCULAR | Status: DC | PRN
Start: 2024-06-24 — End: 2024-06-24

## 2024-06-24 SURGICAL SUPPLY — 21 items
CAP ENDOSCOPE SEALING GYN (Cautery) IMPLANT
CATHETER URETHRAL OD14 FR FOLEY RND TIP INTERMITTENT ALL PURPOSE RED (Catheter Urine) IMPLANT
CATHETER URETHRAL OD16 FR FOLEY ROUND TIP INTERMITTENT ALL PURPOSE (Catheter Urine) IMPLANT
COVER FLEXIBLE MEDLINE LIGHT HANDLE PLASTIC GREEN (Drape) ×1 IMPLANT
DEVICE TISSUE REMOVAL L25.25 IN HANDHELD OD3 MM MYOSURE 17 OZ (Procedure Accessories) IMPLANT
DEVICE TISSUE REMOVAL L25.25 IN HANDHELD OD4 MM MYOSURE XL 17 OZ (Procedure Accessories) IMPLANT
GLOVE SURGICAL 6 1/2 BIOGEL PI INDICATOR UNDERGLOVE POWDER FREE SMOOTH (Glove) ×1 IMPLANT
GLOVE SURGICAL 6 1/2 BIOGEL PI ULTRATOUCH M POWDER FREE BEAD CUFF (Glove) ×1 IMPLANT
HYSTEROSCOPE RIGID 3MM MYOSURE LITE TISSUE (Applicator) IMPLANT
KIT ROOM TURNOVER NONSTERILE LATEX FREE DISPOSABLE (Kits) ×1 IMPLANT
NEEDLE SPINAL L3 1/2 IN REGULAR WALL QUINCKE TIP OD22 GA BD (Needles) IMPLANT
PAD ARMBOARD L20 IN X W8 IN X H2 IN CONVOLUTE FOAM PURPLE (Positioning Supplies) ×2 IMPLANT
PLUG CATHETER PROTECTOR CAP DRAIN TUBE COVER SET MEDLINE (Other) IMPLANT
SET ENDOSCOPIC INSTRUMENT SEAL HYSTEROSCOPE OUTFLOW CHANNEL MYOSURE (Endoscopic Supplies) ×1 IMPLANT
SET FLUID CONTROL INFLOW AQUILEX TUBING (Tubing) ×1 IMPLANT
SET FLUID CONTROL OUTFLOW AQUILEX TUBING (Tubing) ×1 IMPLANT
SET SURGICAL BASIN TRANSFER MEDLINE INDUSTRIES, INC. (Kits) ×1 IMPLANT
SOLUTION IRRIGATION 0.9% SODIUM CHLORIDE 1000 ML PLASTIC POUR BOTTLE (Irrigation Solutions) ×1 IMPLANT
SYRINGE 10 ML CONTROL CONCENTRIC TIP PYROGEN FREE DEHP FREE LOK (Syringes, Needles) IMPLANT
SYRINGE 70 CC GRADUATE ELONGATE TIP EXTRA LARGE ORIFICE LUER ADAPTER (Syringes, Needles) IMPLANT
TRAY SURGICAL MINOR LITHOTOMY (Pack) ×1 IMPLANT

## 2024-06-24 NOTE — Op Note (Addendum)
 Operative Report    Date of Procedure:  06/24/2024    Patient Name:  Robin Rollins Search    Pre-Op Dx:  Polyp of cervix [N84.1]  Metrorrhagia [N92.1]    Post-Op Dx:  same    Procedure:  Procedure(s):  HYSTEROSCOPY, ENDOMETRIAL BIOPSY/POLYPECTOMY, WITH DILATION AND CURETTAGE (D&C)  CERVICAL POLYP EXCISION/ CERVICAL PUNCH BIOPSY    Surgeon:  Surgeon(s):  Melonie Delon SAUNDERS, MD    Assist:  Circulator: Mabel Daria PONCE KANDICE, RN  Scrub Person: Melodye Brunet    Anesthesia:  Anesthesia Choice    Complications:  none    EBL:   5 ml    IVF:  500 mL Lactated Ringers     Specimen: endometrial curettings; cervical polyp    Findings: Friable, erythematous tissue protruding from external os.  Intrauterine finding - thickened tissue anteriorly.  Bilateral ostia identified.  Cervix horseshoe shaped with defect at 6 oclock.      Procedure:    After informed consent, the patient was taken to the operating room where adequate anesthesia was obtained.  She was prepped and draped in the usual sterile fashion, and examination revealed findings above.  A catheter was placed in the bladder and 50 ml of normal saline inserted.  Ultrasound guidance was performed for the duration of the hysteroscopy.  A bivalve speculum was placed in the vagina, and the anterior lip of the cervix was grasped with a single tooth tenaculum.   The cervix was serially dilated, and the hysteroscope was advanced into the uterus with normal saline as the distension medium.  Bilateral tubal ostia were identified.  There was noted to be a thickening of lining of the anterior portion in the uterine cavity.  The myosure was used to shave the lesion.   The polyp forceps were used to grasp the polypoid tissue in fragments.  Monsels was applied to the endocervix.  Hemostasis was noted.   All instruments were removed from the uterus and cervix with hemostasis noted on the cervix.  Normal saline input and output 80 ml.  The patient tolerated the procedure well.  All sponge,  instrument, and needle counts were correct times two.        Delon SAUNDERS Melonie, MD, MD

## 2024-06-24 NOTE — Progress Notes (Signed)
 PACU transfer criteria met. On transfer patient easily arousable. Respirations even and unlabored. IV patent. Vital signs stable. Patient reports pain level as tolerable. Family 20 minutes away. Patient brought to Phase 2. Handoff given to receiving RN Damien.

## 2024-06-24 NOTE — Anesthesia Preprocedure Evaluation (Signed)
 Anesthesia Evaluation    AIRWAY    Mallampati: II    TM distance: >3 FB  Neck ROM: full  Mouth Opening:full   CARDIOVASCULAR    cardiovascular exam normal, regular and normal       DENTAL    no notable dental hx               PULMONARY    pulmonary exam normal and clear to auscultation     OTHER FINDINGS                                        Relevant Problems   No relevant active problems       PSS Anesthesia Comments:          Anesthesia Plan    ASA 2     general               (Appropriately NPO.    History reviewed. No pertinent past medical history.    Lab Results       Component                Value               Date                       WBC                      10.65               09/19/2008                 HGB                      14.6                09/19/2008                 HCT                      43.3                09/19/2008                 MCV                      92.9                09/19/2008                 PLT                      343                 09/19/2008              No results found for: COVID    Discussed with patient GA with LMA/ETT as indicated with risk to include but not limited to N/V, H/A, sore throat.  Questions answered.     )      intravenous induction   Detailed anesthesia plan: general LMA and general endotracheal  Monitors/Adjuncts: other (BP cuff, pulse oximeter, EKG)    Post Op: other (PACU)    Post op pain management: per surgeon  informed consent obtained    Plan discussed with CRNA.      pertinent labs reviewed               Signed by: Edra JINNY Stager, MD 06/24/24 7:18 AM

## 2024-06-24 NOTE — Progress Notes (Signed)
 Discharge instructions printed and discussed with patient and family via phone, both acknowledged understanding of instructions. Copy of instructions provided to patient.

## 2024-06-24 NOTE — H&P (Signed)
 GYN ADMISSION HISTORY AND PHYSICAL EXAM    Date Time: 06/24/24 12:13 AM  Patient Name: Robin Rollins  Attending Physician: Melonie Delon SAUNDERS, MD    CC: No chief complaint on file.      History of Presenting Illness:   Robin Rollins is a 58 y.o. female who presents to the hospital with presents to the hospital with cervical polyp which has caused post coital bleeding.  Attempt had been made to remove the polyp in the office, but the procedure was too painful for the patient.  Pathology showed inflamed granulation tissue.  The patient was treated for bacterial vaginosis and is currently on day 5 of treatment for ureaplasma    Past Medical History:   Hypertension    Past Surgical History:   Past Surgical History[1]  Marsupialization of bartholin's cyst    OB History:     No LMP recorded.  OB History       Gravida   2    Para   2    Term   2    Preterm        AB        Living             SAB        IAB        Ectopic        Multiple        Live Births                     Medications      No medications prior to admission.   HCTZ 12.5 mg qd  Prometrium 100 mcg 1 po qd  Lylanna 0.1 mg patch twice weekly  Allergies:   Allergies[2]    Psychosocial / Family History:   Social History[3]    Family History[4]    Review of Systems:     All negative.    Physical Exam:   Office visit 06/09/24 5 ft 0 in 120 lbs 114/79 67 bpm    Gen: NAD, AAOx3  HEENT: PERRLA  Lungs: CTA bilaterally  Cardiac: RRR  Abdomen: Soft, NT, ND, normal BS  Ext: No C/C/E  External Genitalia: no lesions  Vagina: Normal  Cervix: friable polypoid tissue protruding through external os  Uterus: Normal size  Adnexa: No mass or tenderness    Assessment:   Cervical polyp/postmenopausal bleeding    Plan:     Hysteroscopy D&C  removal of cervical polyp    PRE-PROCEDURE DOCUMENTATION: The patient has been re-examined immediately prior to the planned procedure and is an appropriate candidate for the planned procedure. There are no significant changes in H&P.       The surgical indication, anticipated course, risks and alternatives to the proposed surgery, including the consequences of not having the surgery have been explained to the patient / guardian. Good understanding of these considerations has been demonstrated and she wishes to proceed.        Signed by: Delon SAUNDERS Melonie, MD                        [1]   Past Surgical History:  Procedure Laterality Date    CERVICAL CERCLAGE     [2] No Known Allergies  [3]   Social History  Socioeconomic History    Marital status: Married   Tobacco Use    Smoking status: Never    Smokeless tobacco: Never  Vaping Use    Vaping status: Never Used   Substance and Sexual Activity    Alcohol use: Not Currently    Drug use: No   [4]   Family History  Problem Relation Name Age of Onset    Breast cancer Neg Hx      Ovarian cancer Neg Hx      Cancer Neg Hx      BRCA 1/2 Neg Hx

## 2024-06-24 NOTE — Discharge Instr - AVS First Page (Addendum)
 Discharge Instructions for Hysteroscopy and removal of cervical polyp    Please call Dr Delon Fila [phone] with any questions or concerns.    Home Care  Take it easy.   Return to your normal activities after 24 hours. You may also return to work at that time.  Eat a normal diet.  Take an over-the-counter pain reliever for pain, if needed.  Remember, it's okay to have bleeding for about a week after the procedure. The amount of bleeding should be similar to what you have during a normal period.  Don't lift anything heavier than 10 pounds for 1 week after the procedure.  Don't drive for 24 hours after the procedure.  Don't have sexual intercourse or use tampons or douches until your doctor says it's safe to do so. This usually takes 2 weeks.  Follow-Up  Make a follow-up appointment in 2 weeks.    When to Call Your Doctor  Call your doctor immediately if you have any of the following:  Bleeding that soaks more than one sanitary pad in one hour  Severe abdominal pain  Severe cramps  Fever above 100.43F  Chills  Smelly discharge from your vagina  Trouble urinating or burning when you urinate    [address] [place], [place] [address]. All rights reserved. This information is not intended as a substitute for professional medical care. Always follow your healthcare professional's instructions.     Post Anesthesia Discharge Instructions    Although you may be awake and alert in the recovery room, small amounts of anesthetic remain in your system for about 24 hours.  You may feel tired and sleepy during this time.      You are advised to go directly home from the hospital.    Plan to stay at home and rest for the remainder of the day.    It is advisable to have someone with you at home for 24 hours after surgery.    Do not operate a motor vehicle, or any mechanical or electrical equipment for the next 24 hours.      Be careful when you are walking around, you may become dizzy.  The  effects of anesthesia and/or medications are still present and drowsiness may occur    Do not consume alcohol, tranquilizers, sleeping medications, or any other non prescribed medication for the remainder of the day.    Diet:  begin with liquids, progress your diet as tolerated or as directed by your surgeon.  Nausea and vomiting may occur in the next 24 hours.

## 2024-06-24 NOTE — Addendum Note (Signed)
 Addendum  created 06/24/24 0934 by Jeral Nest, CRNA    Flowsheet accepted, Intraprocedure Flowsheets edited

## 2024-06-24 NOTE — Anesthesia Postprocedure Evaluation (Signed)
 Anesthesia Post Evaluation    Patient: Robin Rollins    HYSTEROSCOPY, ENDOMETRIAL BIOPSY/POLYPECTOMY, WITH DILATION AND CURETTAGE (D&C) ([facility])  CERVICAL POLYP EXCISION/ CERVICAL PUNCH BIOPSY ([facility])    Anesthesia type: general    Last Vitals:   Vitals Value Taken Time   BP 139/80 06/24/24 09:00   Temp 35.8 C (96.4 F) 06/24/24 08:40   Pulse 53 06/24/24 09:00   Resp 16 06/24/24 09:00   SpO2 98 % 06/24/24 09:00                 Anesthesia Post Evaluation:     Patient Evaluated: PACU  Patient Participation: complete - patient participated  Level of Consciousness: sleepy but conscious    Pain Management: adequate  Multimodal analgesia pain management approach    Airway Patency: patent  Two or more mitigation strategies used for obstructive sleep apnea.  Strategies: preop initiation of NIPPV and multimodal analgesia    Anesthetic complications: No      PONV Status: none    Cardiovascular status: acceptable and hemodynamically stable  Respiratory status: acceptable and room air  Hydration status: acceptable          Signed by: Edra JINNY Stager, MD, 06/24/2024 9:10 AM

## 2024-06-27 ENCOUNTER — Encounter: Payer: Self-pay | Admitting: Obstetrics & Gynecology

## 2024-06-29 LAB — SURGICAL PATHOLOGY
